# Patient Record
Sex: Male | Born: 1940 | Race: White | Hispanic: No | Marital: Married | Smoking: Former smoker
Health system: Southern US, Community
[De-identification: ages and names within clinical notes are randomized; demographics above are authoritative.]

## PROBLEM LIST (undated history)

## (undated) DIAGNOSIS — I10 Essential (primary) hypertension: Secondary | ICD-10-CM

## (undated) DIAGNOSIS — I219 Acute myocardial infarction, unspecified: Secondary | ICD-10-CM

## (undated) DIAGNOSIS — N200 Calculus of kidney: Secondary | ICD-10-CM

## (undated) DIAGNOSIS — Z9889 Other specified postprocedural states: Secondary | ICD-10-CM

## (undated) DIAGNOSIS — I2510 Atherosclerotic heart disease of native coronary artery without angina pectoris: Secondary | ICD-10-CM

## (undated) DIAGNOSIS — K219 Gastro-esophageal reflux disease without esophagitis: Secondary | ICD-10-CM

## (undated) DIAGNOSIS — L409 Psoriasis, unspecified: Secondary | ICD-10-CM

## (undated) HISTORY — PX: BRAIN SURGERY: SHX531

## (undated) SURGERY — Surgical Case
Anesthesia: *Unknown

---

## 2012-07-25 ENCOUNTER — Emergency Department: Payer: Self-pay | Admitting: Emergency Medicine

## 2012-07-31 ENCOUNTER — Emergency Department: Payer: Self-pay | Admitting: Emergency Medicine

## 2012-12-23 ENCOUNTER — Emergency Department: Payer: Self-pay | Admitting: Emergency Medicine

## 2012-12-23 LAB — URINALYSIS, COMPLETE
Bilirubin,UR: NEGATIVE
Ketone: NEGATIVE
Leukocyte Esterase: NEGATIVE
Nitrite: NEGATIVE
Ph: 7 (ref 4.5–8.0)
Protein: NEGATIVE
Squamous Epithelial: <1
WBC UR: <1 /HPF (ref 0–5)

## 2012-12-23 LAB — CBC WITH DIFFERENTIAL/PLATELET
HGB: 16.7 g/dL (ref 13.0–18.0)
Lymphocyte #: 3.9 10*3/uL — ABNORMAL HIGH (ref 1.0–3.6)
Lymphocyte %: 26.6 %
MCHC: 33 g/dL (ref 32.0–36.0)
Monocyte %: 7.6 %
Neutrophil #: 9.3 10*3/uL — ABNORMAL HIGH (ref 1.4–6.5)
RDW: 12.9 % (ref 11.5–14.5)
WBC: 14.5 10*3/uL — ABNORMAL HIGH (ref 3.8–10.6)

## 2012-12-23 LAB — COMPREHENSIVE METABOLIC PANEL
Alkaline Phosphatase: 90 U/L (ref 50–136)
Bilirubin,Total: 0.4 mg/dL (ref 0.2–1.0)
Calcium, Total: 9.5 mg/dL (ref 8.5–10.1)
Co2: 27 mmol/L (ref 21–32)
Creatinine: 0.74 mg/dL (ref 0.60–1.30)
EGFR (African American): >60
Glucose: 107 mg/dL — ABNORMAL HIGH (ref 65–99)
Osmolality: 280 (ref 275–301)
Potassium: 4.1 mmol/L (ref 3.5–5.1)
SGOT(AST): 28 U/L (ref 15–37)
SGPT (ALT): 39 U/L (ref 12–78)
Sodium: 139 mmol/L (ref 136–145)
Total Protein: 8.1 g/dL (ref 6.4–8.2)

## 2013-01-08 ENCOUNTER — Ambulatory Visit: Payer: Self-pay | Admitting: Urology

## 2015-02-05 ENCOUNTER — Inpatient Hospital Stay: Payer: Self-pay | Admitting: Internal Medicine

## 2015-02-08 ENCOUNTER — Inpatient Hospital Stay (HOSPITAL_COMMUNITY)
Admit: 2015-02-08 | Discharge: 2015-02-14 | DRG: 236 | Disposition: A | Discharge status: Home / Self Care | Payer: Commercial Managed Care - HMO | Source: Other Acute Inpatient Hospital | Attending: Surgery | Admitting: Surgery

## 2015-02-08 ENCOUNTER — Inpatient Hospital Stay: Admission: AD | Admit: 2015-02-08 | Payer: Self-pay | Source: Other Acute Inpatient Hospital | Admitting: Surgery

## 2015-02-08 ENCOUNTER — Inpatient Hospital Stay: Admit: 2015-02-08 | Payer: Self-pay | Admitting: Surgery

## 2015-02-08 DIAGNOSIS — I214 Non-ST elevation (NSTEMI) myocardial infarction: Secondary | ICD-10-CM | POA: Diagnosis present

## 2015-02-08 DIAGNOSIS — I25700 Atherosclerosis of coronary artery bypass graft(s), unspecified, with unstable angina pectoris: Secondary | ICD-10-CM

## 2015-02-08 DIAGNOSIS — R7309 Other abnormal glucose: Secondary | ICD-10-CM | POA: Diagnosis present

## 2015-02-08 DIAGNOSIS — D62 Acute posthemorrhagic anemia: Secondary | ICD-10-CM | POA: Diagnosis not present

## 2015-02-08 DIAGNOSIS — E8770 Fluid overload, unspecified: Secondary | ICD-10-CM | POA: Diagnosis not present

## 2015-02-08 DIAGNOSIS — I25110 Atherosclerotic heart disease of native coronary artery with unstable angina pectoris: Secondary | ICD-10-CM | POA: Diagnosis not present

## 2015-02-08 DIAGNOSIS — Z951 Presence of aortocoronary bypass graft: Secondary | ICD-10-CM

## 2015-02-08 DIAGNOSIS — R0789 Other chest pain: Secondary | ICD-10-CM | POA: Diagnosis present

## 2015-02-08 DIAGNOSIS — D696 Thrombocytopenia, unspecified: Secondary | ICD-10-CM | POA: Diagnosis not present

## 2015-02-08 DIAGNOSIS — I2510 Atherosclerotic heart disease of native coronary artery without angina pectoris: Secondary | ICD-10-CM | POA: Diagnosis present

## 2015-02-08 DIAGNOSIS — I10 Essential (primary) hypertension: Secondary | ICD-10-CM | POA: Diagnosis present

## 2015-02-08 DIAGNOSIS — Z87891 Personal history of nicotine dependence: Secondary | ICD-10-CM | POA: Diagnosis not present

## 2015-02-08 HISTORY — DX: Calculus of kidney: N20.0

## 2015-02-08 HISTORY — DX: Other specified postprocedural states: Z98.890

## 2015-02-08 NOTE — Progress Notes (Signed)
PATIENT ARRIVED TO TELE UNIT FROM [facility] AS A DIRECT ADMIT, VIA STRETCHER. PATIENT ASSISTED TO BED. TELE APPLIED. VITALS OBTAINED. ASSESSMENT PERFORMED.  PATIENT EDUCATED ON PERSONAL BELONGINGS AND PATIENT CARE AND BEDSIDE EQUIPMENT. INSTRUCTED TO CALL FOR ASSISTANCE WHEN NEEDED.

## 2015-02-09 ENCOUNTER — Encounter (HOSPITAL_COMMUNITY): Payer: Self-pay | Admitting: *Deleted

## 2015-02-09 ENCOUNTER — Inpatient Hospital Stay (HOSPITAL_COMMUNITY): Payer: Commercial Managed Care - HMO

## 2015-02-09 ENCOUNTER — Other Ambulatory Visit: Payer: Self-pay | Admitting: *Deleted

## 2015-02-09 DIAGNOSIS — I2510 Atherosclerotic heart disease of native coronary artery without angina pectoris: Secondary | ICD-10-CM | POA: Diagnosis present

## 2015-02-09 DIAGNOSIS — I25110 Atherosclerotic heart disease of native coronary artery with unstable angina pectoris: Secondary | ICD-10-CM

## 2015-02-09 LAB — URINE MICROSCOPIC-ADD ON

## 2015-02-09 LAB — PULMONARY FUNCTION TEST
DL/VA % pred: 128 %
DL/VA: 5.37 ml/min/mmHg/L
DLCO UNC % PRED: 114 %
DLCO UNC: 27.7 ml/min/mmHg
DLCO cor % pred: 107 %
DLCO cor: 25.97 ml/min/mmHg
FEF 25-75 POST: 2.35 L/s
FEF 25-75 Pre: 2.16 L/sec
FEF2575-%Change-Post: 8 %
FEF2575-%Pred-Post: 125 %
FEF2575-%Pred-Pre: 115 %
FEV1-%Change-Post: 4 %
FEV1-%PRED-PRE: 97 %
FEV1-%Pred-Post: 101 %
FEV1-POST: 2.5 L
FEV1-PRE: 2.4 L
FEV1FVC-%Change-Post: 5 %
FEV1FVC-%PRED-PRE: 105 %
FEV6-%Change-Post: 0 %
FEV6-%PRED-PRE: 96 %
FEV6-%Pred-Post: 96 %
FEV6-Post: 3.07 L
FEV6-Pre: 3.08 L
FEV6FVC-%CHANGE-POST: 1 %
FEV6FVC-%PRED-POST: 107 %
FEV6FVC-%Pred-Pre: 106 %
FVC-%CHANGE-POST: 0 %
FVC-%Pred-Post: 90 %
FVC-%Pred-Pre: 91 %
FVC-PRE: 3.12 L
FVC-Post: 3.1 L
POST FEV6/FVC RATIO: 100 %
PRE FEV1/FVC RATIO: 77 %
PRE FEV6/FVC RATIO: 99 %
Post FEV1/FVC ratio: 81 %
RV % pred: 87 %
RV: 1.87 L
TLC % pred: 88 %
TLC: 5.12 L

## 2015-02-09 LAB — COMPREHENSIVE METABOLIC PANEL
ALBUMIN: 4.3 g/dL (ref 3.5–5.2)
ALK PHOS: 63 U/L (ref 39–117)
ALT: 39 U/L (ref 0–53)
AST: 30 U/L (ref 0–37)
Anion gap: 12 (ref 5–15)
BUN: 13 mg/dL (ref 6–23)
CHLORIDE: 102 mmol/L (ref 96–112)
CO2: 24 mmol/L (ref 19–32)
Calcium: 9.6 mg/dL (ref 8.4–10.5)
Creatinine, Ser: 0.83 mg/dL (ref 0.50–1.35)
GFR calc Af Amer: >90 mL/min (ref >90)
GFR calc non Af Amer: 86 mL/min — ABNORMAL LOW (ref >90)
Glucose, Bld: 122 mg/dL — ABNORMAL HIGH (ref 70–99)
Potassium: 3.8 mmol/L (ref 3.5–5.1)
Sodium: 138 mmol/L (ref 135–145)
Total Bilirubin: 1 mg/dL (ref 0.3–1.2)
Total Protein: 6.9 g/dL (ref 6.0–8.3)

## 2015-02-09 LAB — URINALYSIS, ROUTINE W REFLEX MICROSCOPIC
Bilirubin Urine: NEGATIVE
GLUCOSE, UA: NEGATIVE mg/dL
KETONES UR: NEGATIVE mg/dL
LEUKOCYTES UA: NEGATIVE
Nitrite: NEGATIVE
Protein, ur: NEGATIVE mg/dL
Specific Gravity, Urine: 1.015 (ref 1.005–1.030)
UROBILINOGEN UA: 0.2 mg/dL (ref 0.0–1.0)
pH: 5.5 (ref 5.0–8.0)

## 2015-02-09 LAB — CBC
HCT: 50.1 % (ref 39.0–52.0)
Hemoglobin: 17.2 g/dL — ABNORMAL HIGH (ref 13.0–17.0)
MCH: 31.2 pg (ref 26.0–34.0)
MCHC: 34.3 g/dL (ref 30.0–36.0)
MCV: 90.8 fL (ref 78.0–100.0)
PLATELETS: 222 10*3/uL (ref 150–400)
RBC: 5.52 MIL/uL (ref 4.22–5.81)
RDW: 12.8 % (ref 11.5–15.5)
WBC: 12 10*3/uL — ABNORMAL HIGH (ref 4.0–10.5)

## 2015-02-09 LAB — TYPE AND SCREEN
ABO/RH(D): A POS
ANTIBODY SCREEN: NEGATIVE

## 2015-02-09 LAB — ABO/RH: ABO/RH(D): A POS

## 2015-02-09 LAB — SURGICAL PCR SCREEN
MRSA, PCR: NEGATIVE
STAPHYLOCOCCUS AUREUS: NEGATIVE

## 2015-02-09 LAB — HEPARIN LEVEL (UNFRACTIONATED): Heparin Unfractionated: 0.1 IU/mL — ABNORMAL LOW (ref 0.30–0.70)

## 2015-02-09 MED ORDER — SODIUM CHLORIDE 0.9 % IJ SOLN
3.0000 mL | Freq: Two times a day (BID) | INTRAMUSCULAR | Status: DC
  Administered 2015-02-09 (×2): 3 mL via INTRAVENOUS

## 2015-02-09 MED ORDER — CHLORHEXIDINE GLUCONATE CLOTH 2 % EX PADS: 6.0000 | MEDICATED_PAD | Freq: Once | CUTANEOUS | Status: DC

## 2015-02-09 MED ORDER — NITROGLYCERIN 0.4 MG/SPRAY TL SOLN: 1.0000 | Status: DC | PRN

## 2015-02-09 MED ORDER — BISACODYL 5 MG PO TBEC
5.0000 mg | DELAYED_RELEASE_TABLET | Freq: Once | ORAL | Status: DC
  Filled 2015-02-09: qty 1

## 2015-02-09 MED ORDER — DIAZEPAM 5 MG PO TABS
5.0000 mg | ORAL_TABLET | Freq: Once | ORAL | Status: AC
  Administered 2015-02-10: 5 mg via ORAL
  Filled 2015-02-09: qty 1

## 2015-02-09 MED ORDER — DOPAMINE-DEXTROSE 3.2-5 MG/ML-% IV SOLN
0.0000 ug/kg/min | INTRAVENOUS | Status: DC
  Filled 2015-02-09: qty 250

## 2015-02-09 MED ORDER — METOPROLOL TARTRATE 12.5 MG HALF TABLET
12.5000 mg | ORAL_TABLET | Freq: Once | ORAL | Status: AC
  Administered 2015-02-10: 12.5 mg via ORAL
  Filled 2015-02-09: qty 1

## 2015-02-09 MED ORDER — CHLORHEXIDINE GLUCONATE CLOTH 2 % EX PADS
6.0000 | MEDICATED_PAD | Freq: Once | CUTANEOUS | Status: AC
  Administered 2015-02-09: 6 via TOPICAL

## 2015-02-09 MED ORDER — POTASSIUM CHLORIDE 2 MEQ/ML IV SOLN
80.0000 meq | INTRAVENOUS | Status: DC
  Filled 2015-02-09: qty 40

## 2015-02-09 MED ORDER — PHENYLEPHRINE HCL 10 MG/ML IJ SOLN
30.0000 ug/min | INTRAVENOUS | Status: DC
  Filled 2015-02-09: qty 2

## 2015-02-09 MED ORDER — ASPIRIN EC 81 MG PO TBEC
81.0000 mg | DELAYED_RELEASE_TABLET | Freq: Every day | ORAL | Status: DC
  Administered 2015-02-09: 81 mg via ORAL
  Filled 2015-02-09 (×2): qty 1

## 2015-02-09 MED ORDER — HEPARIN BOLUS VIA INFUSION
4000.0000 [IU] | Freq: Once | INTRAVENOUS | Status: AC
  Administered 2015-02-09: 4000 [IU] via INTRAVENOUS
  Filled 2015-02-09: qty 4000

## 2015-02-09 MED ORDER — PAPAVERINE HCL 30 MG/ML IJ SOLN
INTRAMUSCULAR | Status: AC
  Administered 2015-02-10: 500 mL
  Filled 2015-02-09: qty 2.5

## 2015-02-09 MED ORDER — HEPARIN (PORCINE) IN NACL 100-0.45 UNIT/ML-% IJ SOLN
900.0000 [IU]/h | INTRAMUSCULAR | Status: DC
  Administered 2015-02-09: 900 [IU]/h via INTRAVENOUS
  Filled 2015-02-09: qty 250

## 2015-02-09 MED ORDER — VANCOMYCIN HCL 10 G IV SOLR
1500.0000 mg | INTRAVENOUS | Status: AC
  Administered 2015-02-10: 1500 mg via INTRAVENOUS
  Filled 2015-02-09 (×2): qty 1500

## 2015-02-09 MED ORDER — MORPHINE SULFATE 2 MG/ML IJ SOLN
2.0000 mg | INTRAMUSCULAR | Status: DC | PRN
  Administered 2015-02-09: 2 mg via INTRAVENOUS
  Filled 2015-02-09: qty 1

## 2015-02-09 MED ORDER — HEPARIN SODIUM (PORCINE) 1000 UNIT/ML IJ SOLN
INTRAMUSCULAR | Status: DC
  Filled 2015-02-09: qty 30

## 2015-02-09 MED ORDER — CHLORHEXIDINE GLUCONATE CLOTH 2 % EX PADS
6.0000 | MEDICATED_PAD | Freq: Once | CUTANEOUS | Status: AC
  Administered 2015-02-10: 6 via TOPICAL

## 2015-02-09 MED ORDER — METOPROLOL TARTRATE 25 MG PO TABS
25.0000 mg | ORAL_TABLET | Freq: Two times a day (BID) | ORAL | Status: DC
  Administered 2015-02-09 (×2): 25 mg via ORAL
  Filled 2015-02-09 (×4): qty 1

## 2015-02-09 MED ORDER — HEPARIN BOLUS VIA INFUSION
2000.0000 [IU] | Freq: Once | INTRAVENOUS | Status: AC
  Administered 2015-02-09: 2000 [IU] via INTRAVENOUS
  Filled 2015-02-09: qty 2000

## 2015-02-09 MED ORDER — ALBUTEROL SULFATE (2.5 MG/3ML) 0.083% IN NEBU
2.5000 mg | INHALATION_SOLUTION | Freq: Once | RESPIRATORY_TRACT | Status: AC
  Administered 2015-02-09: 2.5 mg via RESPIRATORY_TRACT

## 2015-02-09 MED ORDER — DEXTROSE 5 % IV SOLN
0.0000 ug/min | INTRAVENOUS | Status: DC
  Filled 2015-02-09: qty 4

## 2015-02-09 MED ORDER — SODIUM CHLORIDE 0.9 % IJ SOLN: 3.0000 mL | INTRAMUSCULAR | Status: DC | PRN

## 2015-02-09 MED ORDER — ALPRAZOLAM 0.25 MG PO TABS: 0.2500 mg | ORAL_TABLET | ORAL | Status: DC | PRN

## 2015-02-09 MED ORDER — HEPARIN (PORCINE) IN NACL 100-0.45 UNIT/ML-% IJ SOLN
1150.0000 [IU]/h | INTRAMUSCULAR | Status: DC
  Administered 2015-02-09 – 2015-02-10 (×2): 1150 [IU]/h via INTRAVENOUS
  Filled 2015-02-09 (×3): qty 250

## 2015-02-09 MED ORDER — DEXMEDETOMIDINE HCL IN NACL 400 MCG/100ML IV SOLN
0.1000 ug/kg/h | INTRAVENOUS | Status: AC
  Administered 2015-02-10: 0.2 ug/kg/h via INTRAVENOUS
  Filled 2015-02-09: qty 100

## 2015-02-09 MED ORDER — DEXTROSE 5 % IV SOLN
1.5000 g | INTRAVENOUS | Status: AC
  Administered 2015-02-10: 1.5 g via INTRAVENOUS
  Administered 2015-02-10: 750 g via INTRAVENOUS
  Filled 2015-02-09: qty 1.5

## 2015-02-09 MED ORDER — SODIUM CHLORIDE 0.9 % IV SOLN
INTRAVENOUS | Status: AC
  Administered 2015-02-10: 68 mL/h via INTRAVENOUS
  Filled 2015-02-09: qty 40

## 2015-02-09 MED ORDER — NITROGLYCERIN 0.4 MG SL SUBL
0.4000 mg | SUBLINGUAL_TABLET | SUBLINGUAL | Status: DC | PRN
  Administered 2015-02-09 (×2): 0.4 mg via SUBLINGUAL
  Filled 2015-02-09 (×4): qty 1

## 2015-02-09 MED ORDER — MAGNESIUM SULFATE 50 % IJ SOLN
40.0000 meq | INTRAMUSCULAR | Status: DC
  Filled 2015-02-09: qty 10

## 2015-02-09 MED ORDER — SIMVASTATIN 40 MG PO TABS
40.0000 mg | ORAL_TABLET | Freq: Every day | ORAL | Status: DC
  Administered 2015-02-09 – 2015-02-13 (×3): 40 mg via ORAL
  Filled 2015-02-09 (×6): qty 1

## 2015-02-09 MED ORDER — TEMAZEPAM 15 MG PO CAPS
15.0000 mg | ORAL_CAPSULE | Freq: Once | ORAL | Status: AC | PRN
  Administered 2015-02-09: 15 mg via ORAL
  Filled 2015-02-09: qty 1

## 2015-02-09 MED ORDER — DEXTROSE 5 % IV SOLN
750.0000 mg | INTRAVENOUS | Status: DC
  Filled 2015-02-09: qty 750

## 2015-02-09 MED ORDER — SODIUM CHLORIDE 0.9 % IV SOLN: 250.0000 mL | INTRAVENOUS | Status: DC | PRN

## 2015-02-09 MED ORDER — NITROGLYCERIN IN D5W 200-5 MCG/ML-% IV SOLN
2.0000 ug/min | INTRAVENOUS | Status: AC
  Administered 2015-02-10: 3 ug/min via INTRAVENOUS
  Filled 2015-02-09: qty 250

## 2015-02-09 MED ORDER — SODIUM CHLORIDE 0.9 % IV SOLN
INTRAVENOUS | Status: AC
  Administered 2015-02-10: 1.2 [IU]/h via INTRAVENOUS
  Filled 2015-02-09: qty 2.5

## 2015-02-09 NOTE — Progress Notes (Addendum)
At 1330 pt called with c/o chest pain 4-5/10, after obtained NTG SL from pyxis and back to pt's room, pt no longer had chest pain. Pt has been letting staff know he has had some mild chest pain today after it has come and gone with pain being a 1-2/10, pain has been occurring after pt has gotten up to side of bed, or up to bathroom, --on exertion.   Pt had been to bathroom and back when he had pain this time.  O2 2L applied, notified Erin Barrett, PA of pt's intermittent CP,  (pt had been on NTG patch at [facility] and is no longer on it-- pt inquiring about it as well as RN if pt needed to be on any kind on continuous nitro patch or paste.). No new orders, to use prn NTG SL or morphine for pain per PA. Will continue use of O2.  Pt and wife at bedside informed of plan.

## 2015-02-09 NOTE — Progress Notes (Signed)
PATIENT CURRENTLY WITHOUT ORDERS. STABLE WITHOUT COMPLAINTS. DR. Cyndia Bent NOTIFIED.

## 2015-02-09 NOTE — Progress Notes (Signed)
VASCULAR LAB PRELIMINARY  PRELIMINARY  PRELIMINARY  PRELIMINARY  Pre-op Cardiac Surgery  Carotid Findings:  Bilateral:  1-39% ICA stenosis.  Vertebral artery flow is antegrade.     Upper Extremity Right Left  Brachial Pressures 137 Triphasic 135 Triphasic  Radial Waveforms Triphasic Triphasic  Ulnar Waveforms Triphasic Triphasic  Palmar Arch (Allen's Test) Abnormal Abnormal   Findings:  Right Doppler waveforms remained normal with radial compression and diminised 50% with ulnar compression. Left Doppler waveforms obliterated with radial compression and diminished greater that 50% with ulnar compression.    Lower  Extremity Right Left  Dorsalis Pedis    Anterior Tibial    Posterior Tibial    Ankle/Brachial Indices      Findings:  Pedal pulses were palpable bilaterally.   Thresea Doble, RVS 02/09/2015, 7:02 PM

## 2015-02-09 NOTE — Progress Notes (Signed)
ANTICOAGULATION CONSULT NOTE - FOLLOW UP    HL = 0.1 (goal 0.3 - 0.7 units/mL) Heparin dosing weight =  78 kg   Assessment: 73 YOM to continue on IV heparin for ACS.  Heparin level sub-therapeutic.  No bleeding nor issue with infusion per RN.   Plan: - Rebolus with 2000 units of IV heparin, then - Increase heparin gtt to 1150 units/hr - Check 8 hr HL - CABG in AM    Demarquez Ciolek D. Mina Marble, PharmD, BCPS 02/09/2015, 8:09 PM

## 2015-02-09 NOTE — Progress Notes (Signed)
ANTICOAGULATION CONSULT NOTE - Initial Consult  Pharmacy Consult for Heparin  Indication: chest pain/ACS  No Known Allergies  Patient Measurements: Height: 5' 4.5" (163.8 cm) Weight: 189 lb 6 oz (85.9 kg) IBW/kg (Calculated) : 60.35 Heparin Dosing Weight: 78 kg   Vital Signs: Temp: 98 F (36.7 C) (03/15 2238) Temp Source: Oral (03/15 2238) BP: 133/70 mmHg (03/15 2238) Pulse Rate: 66 (03/15 2238)  Labs: No results for input(s): HGB, HCT, PLT, APTT, LABPROT, INR, HEPARINUNFRC, CREATININE, CKTOTAL, CKMB, TROPONINI in the last 72 hours.  CrCl cannot be calculated (Patient has no serum creatinine result on file.).   Medical History: Past Medical History  Diagnosis Date  . S/P cardiac cath   . Nephrolithiasis     Medications:  No prescriptions prior to admission    Assessment: 83 YOM brought to [facility] from [facility] for chest pain/NSTEMI. S/p cardiac cath which showed multivessel CAD and patient is now awaiting CABG tomorrow morning. Pharmacy consulted to start IV heparin. At [facility], patient was on twice daily Lovenox but patient unsure when last dose was. Since patient has been here since ~ 10 PM last night, it is safe to assume that patient's has completely cleared the Lovenox.  CBC from [facility] reviewed. H/H and Plt wnl   Goal of Therapy:  Heparin level 0.3-0.7 units/ml Monitor platelets by anticoagulation protocol: Yes   Plan:  -Heparin 4000 units IV bolus followed by heparin infusion at 900 units/hr -F/u 8 hr HL -Monitor daily HL, CBC and s/s of bleeding   Albertina Parr, PharmD., BCPS Clinical Pharmacist Pager [phone]

## 2015-02-09 NOTE — Progress Notes (Signed)
Utilization review completed.  

## 2015-02-09 NOTE — Progress Notes (Signed)
Pt sts he is having 1/10 chest tightness and that that usually increases with activity. Will not ambulate. Discussed sternal precautions, IS, mobility post op. Voiced understanding. Pt given OHS booklet and guideline and video to watch. Pt very motivated. Will f/u post op [id number] Yves Dill CES, ACSM 10:24 AM 02/09/2015

## 2015-02-09 NOTE — H&P (Signed)
[address]       [place] [address]             [phone]        Fain J Lirette Broaddus Medical Record #[id number] Date of Birth: 1941/11/18  Referring: Nehemiah Massed Primary Care: Juluis Pitch, MD  Chief Complaint:   Chest Pain/NSTEMI  History of Present Illness:      Mr. Vanderloop is a 74 yo male with previous history of Kidney stones.  He presented to the Emergency Department at [facility] with complaints of chest pain.  He stated this developed a day prior to admission and later subsided.  However the next day the patient was doing yard work at which time he became diaphoretic and felt like someone was sitting on his chest.  This pain radiated down both arms.  This prompted presentation to the ED.  EKG was unremarkable.  He was hypertensive and his initial troponin level was elevated at 0.8 and he was ruled in for NSTEMI and admitted for further care.  Cardiology consult was obtained and the patient was evaluated by Dr. Nehemiah Massed who recommended Cardiac catheterization.  This was done and showed multivessel CAD and it was Coronary Bypass Grafting would be indicated.  TCTS was consulted and Dr. Cyndia Bent accepted the patient in transfer to [facility] for further care.  Currently the patient is stable.  He continues to have some intermittent chest tightness.  He denies dyspnea, nausea, or vomiting.  He denies recent smoking history, but did use about 30 years ago.  Current Activity/ Functional Status: Patient is independent with mobility/ambulation, transfers, ADL's, IADL's.   Zubrod Score: At the time of surgery this patient's most appropriate activity status/level should be described as: [x]     0    Normal activity, no symptoms []     1    Restricted in physical strenuous activity but ambulatory, able to do out light work []     2    Ambulatory and capable of self care, unable to do work activities, up and about                 more than 50%  Of the time                             []     3    Only limited self care, in bed greater than 50% of waking hours []     4    Completely disabled, no self care, confined to bed or chair []     5    Moribund  Past Medical History  Diagnosis Date  . S/P cardiac cath   . Nephrolithiasis     History reviewed. No pertinent past surgical history.  History  Smoking status  . Not on file  Smokeless tobacco  . Not on file    History  Alcohol Use: Not on file    History   Social History  . Marital Status: Married    Spouse Name: N/A  . Number of Children: N/A  . Years of Education: N/A   Occupational History  . Not on file.   Social History Main Topics  . Smoking status: Not on file  . Smokeless tobacco: Not on file  . Alcohol Use: Not on file  . Drug Use: No  . Sexual Activity: Not on file   Other Topics Concern  . Not on file  Social History Narrative  . No narrative on file    No Known Allergies  Current Facility-Administered Medications  Medication Dose Route Frequency Provider Last Rate Last Dose  . 0.9 %  sodium chloride infusion  250 mL Intravenous PRN Gaye Pollack, MD      . ALPRAZolam Duanne Moron) tablet 0.25-0.5 mg  0.25-0.5 mg Oral Q4H PRN Gaye Pollack, MD      . aspirin EC tablet 81 mg  81 mg Oral Daily Gaye Pollack, MD      . bisacodyl (DULCOLAX) EC tablet 5 mg  5 mg Oral Once Gaye Pollack, MD      . Chlorhexidine Gluconate Cloth 2 % PADS 6 each  6 each Topical Once Gaye Pollack, MD       And  . Chlorhexidine Gluconate Cloth 2 % PADS 6 each  6 each Topical Once Gaye Pollack, MD      . Derrill Memo ON 02/10/2015] diazepam (VALIUM) tablet 5 mg  5 mg Oral Once Gaye Pollack, MD      . Derrill Memo ON 02/10/2015] metoprolol tartrate (LOPRESSOR) tablet 12.5 mg  12.5 mg Oral Once Gaye Pollack, MD      . metoprolol tartrate (LOPRESSOR) tablet 25 mg  25 mg Oral BID Gaye Pollack, MD      . morphine 2 MG/ML injection 2 mg  2 mg Intravenous Q2H PRN Gaye Pollack, MD      .  nitroGLYCERIN (NITROSTAT) SL tablet 0.4 mg  0.4 mg Sublingual Q5 min PRN Gaye Pollack, MD      . simvastatin (ZOCOR) tablet 40 mg  40 mg Oral q1800 Erin R Barrett, PA-C      . sodium chloride 0.9 % injection 3 mL  3 mL Intravenous Q12H Gaye Pollack, MD      . sodium chloride 0.9 % injection 3 mL  3 mL Intravenous PRN Gaye Pollack, MD      . temazepam (RESTORIL) capsule 15 mg  15 mg Oral Once PRN Gaye Pollack, MD        No prescriptions prior to admission   History reviewed. No pertinent family history.  Review of Systems:   Pertinent items are noted in HPI.     Cardiac Review of Systems: Y or N  Chest Pain [  y  ]  Resting SOB [ n  ] Exertional SOB  [n  ]  Orthopnea [  ]   Pedal Edema [   ]    Palpitations [ n ] Syncope  [  ]   Presyncope [   ]  General Review of Systems: [Y] = yes [  ]=no Constitional: recent weight change [  ]; anorexia [  ]; fatigue [  ]; nausea [ n ]; night sweats [  ]; fever [  ]; or chills [  ]                                                               Dental: poor dentition[  ]; Last Dentist visit:   Eye : blurred vision [  ]; diplopia [   ]; vision changes [  ];  Amaurosis fugax[  ]; Resp: cough [n  ];  wheezing[  ];  hemoptysis[  ]; shortness of breath[n  ]; paroxysmal nocturnal dyspnea[  ]; dyspnea on exertion[  ]; or orthopnea[  ];  GI:  gallstones[  ], vomiting[n  ];  dysphagia[  ]; melena[  ];  hematochezia [  ]; heartburn[  ];   Hx of  Colonoscopy[  ]; GU: kidney stones [ y ]; hematuria[  ];   dysuria [  ];  nocturia[  ];  history of     obstruction [  ]; urinary frequency [  ]             Skin: rash, swelling[n  ];, hair loss[  ];  peripheral edema[n  ];  or itching[  ]; Musculosketetal: myalgias[  ];  joint swelling[  ];  joint erythema[  ];  joint pain[  ];  back pain[  ];  Heme/Lymph: bruising[  ];  bleeding[  ];  anemia[  ];  Neuro: TIA[  ];  headaches[  ];  stroke[  ];  vertigo[  ];  seizures[  ];   paresthesias[  ];  difficulty walking[   ];  Psych:depression[  ]; anxiety[  ];  Endocrine: diabetes[  ];  thyroid dysfunction[  ];  Immunizations: Flu [  ]; Pneumococcal[  ];  Other:  Physical Exam: BP 133/70 mmHg  Pulse 66  Temp(Src) 98 F (36.7 C) (Oral)  Resp 18  Ht 5' 4.5" (1.638 m)  Wt 189 lb 6 oz (85.9 kg)  BMI 32.02 kg/m2  SpO2 97%  General appearance: alert, cooperative and no distress Head: Normocephalic, without obvious abnormality, atraumatic Neck: no adenopathy, no carotid bruit, no JVD, supple, symmetrical, trachea midline and thyroid not enlarged, symmetric, no tenderness/mass/nodules Lymph nodes: Cervical, supraclavicular, and axillary nodes normal. Resp: clear to auscultation bilaterally Cardio: regular rate and rhythm GI: soft, non-tender; bowel sounds normal; no masses,  no organomegaly Extremities: extremities normal, atraumatic, no cyanosis or edema Neurologic: Grossly normal  Diagnostic Studies & Laboratory data: CBC, BMET, Hgb A1c Pending, labs reviewed from OSH     Recent Radiology Findings:    I have independently reviewed the above radiologic studies.  Assessment / Plan:   1.  Coronary Artery Disease, S/P NSTEMI- plan for CABG in AM continue Lopressor, ASA, Stain 2. Hypertriglyceridemia- >400 at OSH 3. H/O Kidney Stones 4. Hypertension- continue Lopressor 5. Dispo- patient stable, labs pending, complete preop workup, plan for OR in AM     I discussed the operative procedure with the patient and his wife including alternatives, benefits and risks; including but not limited to bleeding, blood transfusion, infection, stroke, myocardial infarction, graft failure, heart block requiring a permanent pacemaker, organ dysfunction, and death.  Horton Finer understands and agrees to proceed.     Gaye Pollack, MD 02/09/2015

## 2015-02-10 ENCOUNTER — Inpatient Hospital Stay (HOSPITAL_COMMUNITY): Payer: Commercial Managed Care - HMO | Admitting: Anesthesiology

## 2015-02-10 ENCOUNTER — Encounter (HOSPITAL_COMMUNITY): Payer: Self-pay | Admitting: Anesthesiology

## 2015-02-10 ENCOUNTER — Inpatient Hospital Stay (HOSPITAL_COMMUNITY): Payer: Commercial Managed Care - HMO

## 2015-02-10 ENCOUNTER — Encounter (HOSPITAL_COMMUNITY)
Disposition: A | Discharge status: Home / Self Care | Payer: Commercial Managed Care - HMO | Source: Other Acute Inpatient Hospital | Attending: Surgery

## 2015-02-10 HISTORY — PX: TEE WITHOUT CARDIOVERSION: SHX5443

## 2015-02-10 HISTORY — PX: CORONARY ARTERY BYPASS GRAFT: SHX141

## 2015-02-10 LAB — BLOOD GAS, ARTERIAL
Acid-base deficit: 1 mmol/L (ref 0.0–2.0)
BICARBONATE: 23 meq/L (ref 20.0–24.0)
DRAWN BY: 364961
FIO2: 0.21 %
O2 Saturation: 94.9 %
PCO2 ART: 37.3 mmHg (ref 35.0–45.0)
PH ART: 7.407 (ref 7.350–7.450)
Patient temperature: 98.6
TCO2: 24.2 mmol/L (ref 0–100)
pO2, Arterial: 75.4 mmHg — ABNORMAL LOW (ref 80.0–100.0)

## 2015-02-10 LAB — CBC
HCT: 47.4 % (ref 39.0–52.0)
HCT: 36.6 % — ABNORMAL LOW (ref 39.0–52.0)
HCT: 37.9 % — ABNORMAL LOW (ref 39.0–52.0)
HEMOGLOBIN: 12.4 g/dL — AB (ref 13.0–17.0)
Hemoglobin: 16.3 g/dL (ref 13.0–17.0)
Hemoglobin: 13.1 g/dL (ref 13.0–17.0)
MCH: 30.9 pg (ref 26.0–34.0)
MCH: 30.2 pg (ref 26.0–34.0)
MCH: 30.6 pg (ref 26.0–34.0)
MCHC: 34.4 g/dL (ref 30.0–36.0)
MCHC: 33.9 g/dL (ref 30.0–36.0)
MCHC: 34.6 g/dL (ref 30.0–36.0)
MCV: 89.8 fL (ref 78.0–100.0)
MCV: 89.3 fL (ref 78.0–100.0)
MCV: 88.6 fL (ref 78.0–100.0)
PLATELETS: 134 10*3/uL — AB (ref 150–400)
Platelets: 208 10*3/uL (ref 150–400)
Platelets: 178 10*3/uL (ref 150–400)
RBC: 5.28 MIL/uL (ref 4.22–5.81)
RBC: 4.1 MIL/uL — AB (ref 4.22–5.81)
RBC: 4.28 MIL/uL (ref 4.22–5.81)
RDW: 12.9 % (ref 11.5–15.5)
RDW: 12.9 % (ref 11.5–15.5)
RDW: 13 % (ref 11.5–15.5)
WBC: 11.8 10*3/uL — AB (ref 4.0–10.5)
WBC: 19.7 10*3/uL — ABNORMAL HIGH (ref 4.0–10.5)
WBC: 22.1 10*3/uL — ABNORMAL HIGH (ref 4.0–10.5)

## 2015-02-10 LAB — POCT I-STAT, CHEM 8
BUN: 16 mg/dL (ref 6–23)
BUN: 14 mg/dL (ref 6–23)
BUN: 14 mg/dL (ref 6–23)
BUN: 13 mg/dL (ref 6–23)
BUN: 13 mg/dL (ref 6–23)
BUN: 11 mg/dL (ref 6–23)
CALCIUM ION: 1.23 mmol/L (ref 1.13–1.30)
CALCIUM ION: 1.21 mmol/L (ref 1.13–1.30)
CALCIUM ION: 1.06 mmol/L — AB (ref 1.13–1.30)
CALCIUM ION: 1.14 mmol/L (ref 1.13–1.30)
CHLORIDE: 99 mmol/L (ref 96–112)
CHLORIDE: 102 mmol/L (ref 96–112)
CHLORIDE: 101 mmol/L (ref 96–112)
CREATININE: 0.6 mg/dL (ref 0.50–1.35)
CREATININE: 0.7 mg/dL (ref 0.50–1.35)
Calcium, Ion: 1.13 mmol/L (ref 1.13–1.30)
Calcium, Ion: 1.08 mmol/L — ABNORMAL LOW (ref 1.13–1.30)
Chloride: 103 mmol/L (ref 96–112)
Chloride: 103 mmol/L (ref 96–112)
Chloride: 102 mmol/L (ref 96–112)
Creatinine, Ser: 0.7 mg/dL (ref 0.50–1.35)
Creatinine, Ser: 0.6 mg/dL (ref 0.50–1.35)
Creatinine, Ser: 0.6 mg/dL (ref 0.50–1.35)
Creatinine, Ser: 0.6 mg/dL (ref 0.50–1.35)
GLUCOSE: 108 mg/dL — AB (ref 70–99)
Glucose, Bld: 100 mg/dL — ABNORMAL HIGH (ref 70–99)
Glucose, Bld: 104 mg/dL — ABNORMAL HIGH (ref 70–99)
Glucose, Bld: 122 mg/dL — ABNORMAL HIGH (ref 70–99)
Glucose, Bld: 143 mg/dL — ABNORMAL HIGH (ref 70–99)
Glucose, Bld: 132 mg/dL — ABNORMAL HIGH (ref 70–99)
HCT: 44 % (ref 39.0–52.0)
HCT: 40 % (ref 39.0–52.0)
HCT: 33 % — ABNORMAL LOW (ref 39.0–52.0)
HCT: 38 % — ABNORMAL LOW (ref 39.0–52.0)
HEMATOCRIT: 31 % — AB (ref 39.0–52.0)
HEMATOCRIT: 33 % — AB (ref 39.0–52.0)
HEMOGLOBIN: 10.5 g/dL — AB (ref 13.0–17.0)
Hemoglobin: 15 g/dL (ref 13.0–17.0)
Hemoglobin: 13.6 g/dL (ref 13.0–17.0)
Hemoglobin: 11.2 g/dL — ABNORMAL LOW (ref 13.0–17.0)
Hemoglobin: 11.2 g/dL — ABNORMAL LOW (ref 13.0–17.0)
Hemoglobin: 12.9 g/dL — ABNORMAL LOW (ref 13.0–17.0)
POTASSIUM: 3.9 mmol/L (ref 3.5–5.1)
POTASSIUM: 3.9 mmol/L (ref 3.5–5.1)
POTASSIUM: 4.1 mmol/L (ref 3.5–5.1)
Potassium: 5.3 mmol/L — ABNORMAL HIGH (ref 3.5–5.1)
Potassium: 5 mmol/L (ref 3.5–5.1)
Potassium: 4.3 mmol/L (ref 3.5–5.1)
Sodium: 140 mmol/L (ref 135–145)
Sodium: 140 mmol/L (ref 135–145)
Sodium: 134 mmol/L — ABNORMAL LOW (ref 135–145)
Sodium: 137 mmol/L (ref 135–145)
Sodium: 138 mmol/L (ref 135–145)
Sodium: 140 mmol/L (ref 135–145)
TCO2: 20 mmol/L (ref 0–100)
TCO2: 22 mmol/L (ref 0–100)
TCO2: 22 mmol/L (ref 0–100)
TCO2: 21 mmol/L (ref 0–100)
TCO2: 20 mmol/L (ref 0–100)
TCO2: 22 mmol/L (ref 0–100)

## 2015-02-10 LAB — POCT I-STAT 3, ART BLOOD GAS (G3+)
ACID-BASE DEFICIT: 1 mmol/L (ref 0.0–2.0)
ACID-BASE DEFICIT: 4 mmol/L — AB (ref 0.0–2.0)
ACID-BASE DEFICIT: 6 mmol/L — AB (ref 0.0–2.0)
Acid-base deficit: 6 mmol/L — ABNORMAL HIGH (ref 0.0–2.0)
Acid-base deficit: 5 mmol/L — ABNORMAL HIGH (ref 0.0–2.0)
BICARBONATE: 25 meq/L — AB (ref 20.0–24.0)
Bicarbonate: 21.8 mEq/L (ref 20.0–24.0)
Bicarbonate: 19.9 mEq/L — ABNORMAL LOW (ref 20.0–24.0)
Bicarbonate: 20.4 mEq/L (ref 20.0–24.0)
Bicarbonate: 21.6 mEq/L (ref 20.0–24.0)
O2 SAT: 100 %
O2 SAT: 92 %
O2 SAT: 94 %
O2 Saturation: 100 %
O2 Saturation: 94 %
PCO2 ART: 43.7 mmHg (ref 35.0–45.0)
PH ART: 7.32 — AB (ref 7.350–7.450)
PH ART: 7.303 — AB (ref 7.350–7.450)
PO2 ART: 228 mmHg — AB (ref 80.0–100.0)
PO2 ART: 66 mmHg — AB (ref 80.0–100.0)
PO2 ART: 81 mmHg (ref 80.0–100.0)
Patient temperature: 36.7
Patient temperature: 37.4
Patient temperature: 37.4
TCO2: 26 mmol/L (ref 0–100)
TCO2: 23 mmol/L (ref 0–100)
TCO2: 21 mmol/L (ref 0–100)
TCO2: 22 mmol/L (ref 0–100)
TCO2: 23 mmol/L (ref 0–100)
pCO2 arterial: 46.2 mmHg — ABNORMAL HIGH (ref 35.0–45.0)
pCO2 arterial: 40.2 mmHg (ref 35.0–45.0)
pCO2 arterial: 38.6 mmHg (ref 35.0–45.0)
pCO2 arterial: 43.5 mmHg (ref 35.0–45.0)
pH, Arterial: 7.341 — ABNORMAL LOW (ref 7.350–7.450)
pH, Arterial: 7.342 — ABNORMAL LOW (ref 7.350–7.450)
pH, Arterial: 7.28 — ABNORMAL LOW (ref 7.350–7.450)
pO2, Arterial: 378 mmHg — ABNORMAL HIGH (ref 80.0–100.0)
pO2, Arterial: 80 mmHg (ref 80.0–100.0)

## 2015-02-10 LAB — HEPARIN LEVEL (UNFRACTIONATED): HEPARIN UNFRACTIONATED: 0.23 [IU]/mL — AB (ref 0.30–0.70)

## 2015-02-10 LAB — CREATININE, SERUM
Creatinine, Ser: 0.77 mg/dL (ref 0.50–1.35)
GFR calc Af Amer: >90 mL/min (ref >90)
GFR calc non Af Amer: 89 mL/min — ABNORMAL LOW (ref >90)

## 2015-02-10 LAB — POCT I-STAT 4, (NA,K, GLUC, HGB,HCT)
Glucose, Bld: 107 mg/dL — ABNORMAL HIGH (ref 70–99)
HCT: 35 % — ABNORMAL LOW (ref 39.0–52.0)
Hemoglobin: 11.9 g/dL — ABNORMAL LOW (ref 13.0–17.0)
POTASSIUM: 4 mmol/L (ref 3.5–5.1)
SODIUM: 140 mmol/L (ref 135–145)

## 2015-02-10 LAB — HEMOGLOBIN AND HEMATOCRIT, BLOOD
HCT: 33.1 % — ABNORMAL LOW (ref 39.0–52.0)
HEMOGLOBIN: 11.6 g/dL — AB (ref 13.0–17.0)

## 2015-02-10 LAB — GLUCOSE, CAPILLARY
GLUCOSE-CAPILLARY: 133 mg/dL — AB (ref 70–99)
GLUCOSE-CAPILLARY: 69 mg/dL — AB (ref 70–99)
GLUCOSE-CAPILLARY: 117 mg/dL — AB (ref 70–99)
GLUCOSE-CAPILLARY: 152 mg/dL — AB (ref 70–99)
GLUCOSE-CAPILLARY: 106 mg/dL — AB (ref 70–99)
Glucose-Capillary: 105 mg/dL — ABNORMAL HIGH (ref 70–99)
Glucose-Capillary: 113 mg/dL — ABNORMAL HIGH (ref 70–99)
Glucose-Capillary: 120 mg/dL — ABNORMAL HIGH (ref 70–99)
Glucose-Capillary: 138 mg/dL — ABNORMAL HIGH (ref 70–99)
Glucose-Capillary: 113 mg/dL — ABNORMAL HIGH (ref 70–99)
Glucose-Capillary: 117 mg/dL — ABNORMAL HIGH (ref 70–99)

## 2015-02-10 LAB — APTT
aPTT: 38 seconds — ABNORMAL HIGH (ref 24–37)
aPTT: 77 seconds — ABNORMAL HIGH (ref 24–37)

## 2015-02-10 LAB — HEMOGLOBIN A1C
HEMOGLOBIN A1C: 5.6 % (ref 4.8–5.6)
Mean Plasma Glucose: 114 mg/dL

## 2015-02-10 LAB — MAGNESIUM: Magnesium: 2.6 mg/dL — ABNORMAL HIGH (ref 1.5–2.5)

## 2015-02-10 LAB — PROTIME-INR
INR: 1.01 (ref 0.00–1.49)
INR: 1.35 (ref 0.00–1.49)
PROTHROMBIN TIME: 13.4 s (ref 11.6–15.2)
Prothrombin Time: 16.8 seconds — ABNORMAL HIGH (ref 11.6–15.2)

## 2015-02-10 LAB — PLATELET COUNT: PLATELETS: 158 10*3/uL (ref 150–400)

## 2015-02-10 SURGERY — CORONARY ARTERY BYPASS GRAFTING (CABG)
Anesthesia: General | Site: Chest

## 2015-02-10 MED ORDER — SUCCINYLCHOLINE CHLORIDE 20 MG/ML IJ SOLN
INTRAMUSCULAR | Status: AC
  Filled 2015-02-10: qty 1

## 2015-02-10 MED ORDER — DEXMEDETOMIDINE HCL IN NACL 200 MCG/50ML IV SOLN
0.0000 ug/kg/h | INTRAVENOUS | Status: DC
  Filled 2015-02-10: qty 50

## 2015-02-10 MED ORDER — DEXTROSE 50 % IV SOLN
INTRAVENOUS | Status: AC
  Administered 2015-02-10: 50 mL
  Filled 2015-02-10: qty 50

## 2015-02-10 MED ORDER — VECURONIUM BROMIDE 10 MG IV SOLR
INTRAVENOUS | Status: DC | PRN
  Administered 2015-02-10 (×2): 5 mg via INTRAVENOUS

## 2015-02-10 MED ORDER — PHENYLEPHRINE 40 MCG/ML (10ML) SYRINGE FOR IV PUSH (FOR BLOOD PRESSURE SUPPORT)
PREFILLED_SYRINGE | INTRAVENOUS | Status: AC
  Filled 2015-02-10: qty 10

## 2015-02-10 MED ORDER — PROPOFOL 10 MG/ML IV BOLUS
INTRAVENOUS | Status: DC | PRN
  Administered 2015-02-10: 20 mg via INTRAVENOUS

## 2015-02-10 MED ORDER — MIDAZOLAM HCL 2 MG/2ML IJ SOLN: 2.0000 mg | INTRAMUSCULAR | Status: DC | PRN

## 2015-02-10 MED ORDER — LACTATED RINGERS IV SOLN: INTRAVENOUS | Status: DC

## 2015-02-10 MED ORDER — PANTOPRAZOLE SODIUM 40 MG PO TBEC: 40.0000 mg | DELAYED_RELEASE_TABLET | Freq: Every day | ORAL | Status: DC

## 2015-02-10 MED ORDER — HEPARIN SODIUM (PORCINE) 1000 UNIT/ML IJ SOLN
INTRAMUSCULAR | Status: DC | PRN
  Administered 2015-02-10: 25000 [IU] via INTRAVENOUS

## 2015-02-10 MED ORDER — HEPARIN SODIUM (PORCINE) 1000 UNIT/ML IJ SOLN
INTRAMUSCULAR | Status: AC
  Filled 2015-02-10: qty 1

## 2015-02-10 MED ORDER — CETYLPYRIDINIUM CHLORIDE 0.05 % MT LIQD: 7.0000 mL | Freq: Four times a day (QID) | OROMUCOSAL | Status: DC

## 2015-02-10 MED ORDER — AMIODARONE HCL IN DEXTROSE 360-4.14 MG/200ML-% IV SOLN
30.0000 mg/h | INTRAVENOUS | Status: DC
  Administered 2015-02-11: 30 mg/h via INTRAVENOUS
  Filled 2015-02-10 (×3): qty 200

## 2015-02-10 MED ORDER — ACETAMINOPHEN 500 MG PO TABS
1000.0000 mg | ORAL_TABLET | Freq: Four times a day (QID) | ORAL | Status: DC
  Administered 2015-02-11 (×2): 1000 mg via ORAL
  Filled 2015-02-10 (×6): qty 2

## 2015-02-10 MED ORDER — SODIUM CHLORIDE 0.9 % IV SOLN
INTRAVENOUS | Status: DC
Start: 2015-02-10 — End: 2015-02-11
  Administered 2015-02-10: 13:00:00 via INTRAVENOUS

## 2015-02-10 MED ORDER — DOCUSATE SODIUM 100 MG PO CAPS
200.0000 mg | ORAL_CAPSULE | Freq: Every day | ORAL | Status: DC
  Administered 2015-02-11: 200 mg via ORAL
  Filled 2015-02-10: qty 2

## 2015-02-10 MED ORDER — VECURONIUM BROMIDE 10 MG IV SOLR
INTRAVENOUS | Status: AC
  Filled 2015-02-10: qty 10

## 2015-02-10 MED ORDER — METOPROLOL TARTRATE 1 MG/ML IV SOLN: 2.5000 mg | INTRAVENOUS | Status: DC | PRN

## 2015-02-10 MED ORDER — MORPHINE SULFATE 2 MG/ML IJ SOLN
1.0000 mg | INTRAMUSCULAR | Status: DC | PRN
  Administered 2015-02-10: 2 mg via INTRAVENOUS

## 2015-02-10 MED ORDER — THROMBIN 20000 UNITS EX SOLR
CUTANEOUS | Status: AC
  Filled 2015-02-10: qty 20000

## 2015-02-10 MED ORDER — AMIODARONE HCL IN DEXTROSE 360-4.14 MG/200ML-% IV SOLN
60.0000 mg/h | INTRAVENOUS | Status: AC
  Administered 2015-02-10 (×2): 60 mg/h via INTRAVENOUS
  Filled 2015-02-10: qty 200

## 2015-02-10 MED ORDER — ALBUMIN HUMAN 5 % IV SOLN
250.0000 mL | INTRAVENOUS | Status: DC | PRN
  Administered 2015-02-10 (×2): 250 mL via INTRAVENOUS

## 2015-02-10 MED ORDER — AMIODARONE LOAD VIA INFUSION
150.0000 mg | Freq: Once | INTRAVENOUS | Status: AC
  Administered 2015-02-10: 150 mg via INTRAVENOUS
  Filled 2015-02-10: qty 83.34

## 2015-02-10 MED ORDER — AMIODARONE HCL IN DEXTROSE 360-4.14 MG/200ML-% IV SOLN
INTRAVENOUS | Status: AC
  Filled 2015-02-10: qty 200

## 2015-02-10 MED ORDER — METOPROLOL TARTRATE 25 MG/10 ML ORAL SUSPENSION
12.5000 mg | Freq: Two times a day (BID) | ORAL | Status: DC
  Filled 2015-02-10 (×3): qty 5

## 2015-02-10 MED ORDER — CHLORHEXIDINE GLUCONATE 0.12 % MT SOLN
15.0000 mL | Freq: Two times a day (BID) | OROMUCOSAL | Status: DC
Start: 2015-02-10 — End: 2015-02-10

## 2015-02-10 MED ORDER — ACETAMINOPHEN 160 MG/5ML PO SOLN
1000.0000 mg | Freq: Four times a day (QID) | ORAL | Status: DC
  Filled 2015-02-10: qty 40

## 2015-02-10 MED ORDER — LIDOCAINE HCL (CARDIAC) 20 MG/ML IV SOLN
INTRAVENOUS | Status: DC | PRN
  Administered 2015-02-10: 30 mg via INTRAVENOUS

## 2015-02-10 MED ORDER — LACTATED RINGERS IV SOLN
INTRAVENOUS | Status: DC
  Administered 2015-02-10: 14:00:00 via INTRAVENOUS

## 2015-02-10 MED ORDER — SODIUM CHLORIDE 0.9 % IV SOLN: 250.0000 mL | INTRAVENOUS | Status: DC

## 2015-02-10 MED ORDER — MAGNESIUM SULFATE 4 GM/100ML IV SOLN
4.0000 g | Freq: Once | INTRAVENOUS | Status: AC
  Administered 2015-02-10: 4 g via INTRAVENOUS
  Filled 2015-02-10: qty 100

## 2015-02-10 MED ORDER — ACETAMINOPHEN 650 MG RE SUPP: 650.0000 mg | Freq: Once | RECTAL | Status: AC

## 2015-02-10 MED ORDER — PROTAMINE SULFATE 10 MG/ML IV SOLN
INTRAVENOUS | Status: DC | PRN
  Administered 2015-02-10 (×2): 100 mg via INTRAVENOUS

## 2015-02-10 MED ORDER — ROCURONIUM BROMIDE 50 MG/5ML IV SOLN
INTRAVENOUS | Status: AC
  Filled 2015-02-10: qty 1

## 2015-02-10 MED ORDER — THROMBIN 20000 UNITS EX SOLR
CUTANEOUS | Status: DC | PRN
  Administered 2015-02-10: 20000 [IU] via TOPICAL

## 2015-02-10 MED ORDER — ALBUMIN HUMAN 5 % IV SOLN
INTRAVENOUS | Status: DC | PRN
  Administered 2015-02-10 (×2): via INTRAVENOUS

## 2015-02-10 MED ORDER — AMIODARONE HCL IN DEXTROSE 360-4.14 MG/200ML-% IV SOLN
INTRAVENOUS | Status: DC | PRN
  Administered 2015-02-10 (×2): 30 mg/h via INTRAVENOUS

## 2015-02-10 MED ORDER — ARTIFICIAL TEARS OP OINT
TOPICAL_OINTMENT | OPHTHALMIC | Status: AC
  Filled 2015-02-10: qty 3.5

## 2015-02-10 MED ORDER — FAMOTIDINE IN NACL 20-0.9 MG/50ML-% IV SOLN
20.0000 mg | Freq: Two times a day (BID) | INTRAVENOUS | Status: DC
  Administered 2015-02-10: 20 mg via INTRAVENOUS

## 2015-02-10 MED ORDER — LACTATED RINGERS IV SOLN
INTRAVENOUS | Status: DC | PRN
  Administered 2015-02-10: 07:00:00 via INTRAVENOUS

## 2015-02-10 MED ORDER — PROPOFOL 10 MG/ML IV BOLUS
INTRAVENOUS | Status: AC
  Filled 2015-02-10: qty 20

## 2015-02-10 MED ORDER — FENTANYL CITRATE 0.05 MG/ML IJ SOLN
INTRAMUSCULAR | Status: AC
  Filled 2015-02-10: qty 5

## 2015-02-10 MED ORDER — ROCURONIUM BROMIDE 100 MG/10ML IV SOLN
INTRAVENOUS | Status: DC | PRN
Start: 2015-02-10 — End: 2015-02-10
  Administered 2015-02-10: 100 mg via INTRAVENOUS
  Administered 2015-02-10: 50 mg via INTRAVENOUS

## 2015-02-10 MED ORDER — MORPHINE SULFATE 2 MG/ML IJ SOLN
2.0000 mg | INTRAMUSCULAR | Status: DC | PRN
  Administered 2015-02-10: 2 mg via INTRAVENOUS
  Administered 2015-02-11: 4 mg via INTRAVENOUS
  Administered 2015-02-11: 2 mg via INTRAVENOUS
  Administered 2015-02-11 (×3): 4 mg via INTRAVENOUS
  Filled 2015-02-10: qty 2
  Filled 2015-02-10: qty 1
  Filled 2015-02-10: qty 2
  Filled 2015-02-10: qty 1
  Filled 2015-02-10 (×3): qty 2

## 2015-02-10 MED ORDER — FENTANYL CITRATE 0.05 MG/ML IJ SOLN
INTRAMUSCULAR | Status: DC | PRN
  Administered 2015-02-10: 650 ug via INTRAVENOUS
  Administered 2015-02-10: 100 ug via INTRAVENOUS
  Administered 2015-02-10: 150 ug via INTRAVENOUS
  Administered 2015-02-10: 50 ug via INTRAVENOUS
  Administered 2015-02-10: 200 ug via INTRAVENOUS
  Administered 2015-02-10: 100 ug via INTRAVENOUS

## 2015-02-10 MED ORDER — TRAMADOL HCL 50 MG PO TABS
50.0000 mg | ORAL_TABLET | ORAL | Status: DC | PRN
  Administered 2015-02-11: 100 mg via ORAL
  Filled 2015-02-10: qty 2

## 2015-02-10 MED ORDER — OXYCODONE HCL 5 MG PO TABS
5.0000 mg | ORAL_TABLET | ORAL | Status: DC | PRN
  Administered 2015-02-11 (×6): 10 mg via ORAL
  Filled 2015-02-10 (×6): qty 2

## 2015-02-10 MED ORDER — MIDAZOLAM HCL 10 MG/2ML IJ SOLN
INTRAMUSCULAR | Status: AC
  Filled 2015-02-10: qty 2

## 2015-02-10 MED ORDER — MIDAZOLAM HCL 5 MG/5ML IJ SOLN
INTRAMUSCULAR | Status: DC | PRN
  Administered 2015-02-10: 2 mg via INTRAVENOUS
  Administered 2015-02-10: 4 mg via INTRAVENOUS
  Administered 2015-02-10 (×4): 1 mg via INTRAVENOUS
  Administered 2015-02-10: 2 mg via INTRAVENOUS

## 2015-02-10 MED ORDER — SODIUM BICARBONATE 8.4 % IV SOLN
50.0000 meq | Freq: Once | INTRAVENOUS | Status: AC
  Administered 2015-02-10: 50 meq via INTRAVENOUS

## 2015-02-10 MED ORDER — BISACODYL 10 MG RE SUPP: 10.0000 mg | Freq: Every day | RECTAL | Status: DC

## 2015-02-10 MED ORDER — MIDAZOLAM HCL 2 MG/2ML IJ SOLN
INTRAMUSCULAR | Status: AC
  Filled 2015-02-10: qty 2

## 2015-02-10 MED ORDER — AMIODARONE HCL IN DEXTROSE 360-4.14 MG/200ML-% IV SOLN
30.0000 mg/h | INTRAVENOUS | Status: DC
  Administered 2015-02-10: 30 mg/h via INTRAVENOUS
  Filled 2015-02-10 (×3): qty 200

## 2015-02-10 MED ORDER — PHENYLEPHRINE HCL 10 MG/ML IJ SOLN
0.0000 ug/min | INTRAVENOUS | Status: DC
  Administered 2015-02-10: 35 ug/min via INTRAVENOUS
  Administered 2015-02-11 (×2): 45 ug/min via INTRAVENOUS
  Filled 2015-02-10 (×4): qty 2

## 2015-02-10 MED ORDER — DEXTROSE 5 % IV SOLN
1.5000 g | Freq: Two times a day (BID) | INTRAVENOUS | Status: DC
  Administered 2015-02-10 – 2015-02-11 (×2): 1.5 g via INTRAVENOUS
  Filled 2015-02-10 (×4): qty 1.5

## 2015-02-10 MED ORDER — VANCOMYCIN HCL IN DEXTROSE 1-5 GM/200ML-% IV SOLN
1000.0000 mg | Freq: Once | INTRAVENOUS | Status: AC
  Administered 2015-02-10: 1000 mg via INTRAVENOUS
  Filled 2015-02-10: qty 200

## 2015-02-10 MED ORDER — SODIUM CHLORIDE 0.9 % IV SOLN
INTRAVENOUS | Status: DC
  Administered 2015-02-11: 1.2 [IU]/h via INTRAVENOUS
  Filled 2015-02-10 (×2): qty 2.5

## 2015-02-10 MED ORDER — SODIUM CHLORIDE 0.9 % IJ SOLN
INTRAMUSCULAR | Status: AC
  Filled 2015-02-10: qty 20

## 2015-02-10 MED ORDER — SODIUM CHLORIDE 0.9 % IV BOLUS (SEPSIS)
250.0000 mL | Freq: Once | INTRAVENOUS | Status: AC
  Administered 2015-02-10: 250 mL via INTRAVENOUS

## 2015-02-10 MED ORDER — SODIUM BICARBONATE 8.4 % IV SOLN
50.0000 meq | Freq: Once | INTRAVENOUS | Status: AC
Start: 2015-02-10 — End: 2015-02-10
  Administered 2015-02-10: 50 meq via INTRAVENOUS

## 2015-02-10 MED ORDER — STERILE WATER FOR INJECTION IJ SOLN
INTRAMUSCULAR | Status: AC
  Filled 2015-02-10: qty 10

## 2015-02-10 MED ORDER — METOCLOPRAMIDE HCL 5 MG/ML IJ SOLN
10.0000 mg | Freq: Four times a day (QID) | INTRAMUSCULAR | Status: DC
  Administered 2015-02-10 – 2015-02-11 (×3): 10 mg via INTRAVENOUS
  Filled 2015-02-10 (×2): qty 2

## 2015-02-10 MED ORDER — METOPROLOL TARTRATE 12.5 MG HALF TABLET
12.5000 mg | ORAL_TABLET | Freq: Two times a day (BID) | ORAL | Status: DC
  Administered 2015-02-11: 12.5 mg via ORAL
  Filled 2015-02-10 (×3): qty 1

## 2015-02-10 MED ORDER — SODIUM CHLORIDE 0.9 % IV SOLN
20.0000 mg | INTRAVENOUS | Status: DC | PRN
  Administered 2015-02-10: 20 ug/min via INTRAVENOUS

## 2015-02-10 MED ORDER — SODIUM CHLORIDE 0.45 % IV SOLN
INTRAVENOUS | Status: DC | PRN
  Administered 2015-02-10: 13:00:00 via INTRAVENOUS

## 2015-02-10 MED ORDER — HEMOSTATIC AGENTS (NO CHARGE) OPTIME
TOPICAL | Status: DC | PRN
  Administered 2015-02-10: 1 via TOPICAL

## 2015-02-10 MED ORDER — ONDANSETRON HCL 4 MG/2ML IJ SOLN: 4.0000 mg | Freq: Four times a day (QID) | INTRAMUSCULAR | Status: DC | PRN

## 2015-02-10 MED ORDER — LACTATED RINGERS IV SOLN
INTRAVENOUS | Status: DC | PRN
  Administered 2015-02-10 (×2): via INTRAVENOUS

## 2015-02-10 MED ORDER — THROMBIN 20000 UNITS EX SOLR
OROMUCOSAL | Status: DC | PRN
  Administered 2015-02-10 (×3): 4 mL via TOPICAL

## 2015-02-10 MED ORDER — ARTIFICIAL TEARS OP OINT
TOPICAL_OINTMENT | OPHTHALMIC | Status: DC | PRN
  Administered 2015-02-10: 1 via OPHTHALMIC

## 2015-02-10 MED ORDER — SODIUM CHLORIDE 0.9 % IJ SOLN
3.0000 mL | Freq: Two times a day (BID) | INTRAMUSCULAR | Status: DC
  Administered 2015-02-11 (×2): 3 mL via INTRAVENOUS

## 2015-02-10 MED ORDER — SODIUM CHLORIDE 0.9 % IJ SOLN: 3.0000 mL | INTRAMUSCULAR | Status: DC | PRN

## 2015-02-10 MED ORDER — BISACODYL 5 MG PO TBEC
10.0000 mg | DELAYED_RELEASE_TABLET | Freq: Every day | ORAL | Status: DC
  Administered 2015-02-11: 10 mg via ORAL
  Filled 2015-02-10: qty 2

## 2015-02-10 MED ORDER — EPHEDRINE SULFATE 50 MG/ML IJ SOLN
INTRAMUSCULAR | Status: AC
Start: 2015-02-10 — End: 2015-02-10
  Filled 2015-02-10: qty 1

## 2015-02-10 MED ORDER — PROTAMINE SULFATE 10 MG/ML IV SOLN
INTRAVENOUS | Status: AC
  Filled 2015-02-10: qty 25

## 2015-02-10 MED ORDER — LACTATED RINGERS IV SOLN: 500.0000 mL | Freq: Once | INTRAVENOUS | Status: AC | PRN

## 2015-02-10 MED ORDER — 0.9 % SODIUM CHLORIDE (POUR BTL) OPTIME
TOPICAL | Status: DC | PRN
  Administered 2015-02-10: 6000 mL

## 2015-02-10 MED ORDER — ASPIRIN 81 MG PO CHEW: 324.0000 mg | CHEWABLE_TABLET | Freq: Every day | ORAL | Status: DC

## 2015-02-10 MED ORDER — LIDOCAINE HCL (CARDIAC) 20 MG/ML IV SOLN
INTRAVENOUS | Status: AC
Start: 2015-02-10 — End: 2015-02-10
  Filled 2015-02-10: qty 5

## 2015-02-10 MED ORDER — INSULIN REGULAR BOLUS VIA INFUSION
0.0000 [IU] | Freq: Three times a day (TID) | INTRAVENOUS | Status: DC
  Filled 2015-02-10: qty 10

## 2015-02-10 MED ORDER — POTASSIUM CHLORIDE 10 MEQ/50ML IV SOLN: 10.0000 meq | INTRAVENOUS | Status: AC

## 2015-02-10 MED ORDER — NITROGLYCERIN IN D5W 200-5 MCG/ML-% IV SOLN: 0.0000 ug/min | INTRAVENOUS | Status: DC

## 2015-02-10 MED ORDER — ACETAMINOPHEN 160 MG/5ML PO SOLN
650.0000 mg | Freq: Once | ORAL | Status: AC
  Administered 2015-02-10: 650 mg

## 2015-02-10 MED ORDER — ASPIRIN EC 325 MG PO TBEC
325.0000 mg | DELAYED_RELEASE_TABLET | Freq: Every day | ORAL | Status: DC
  Administered 2015-02-11: 325 mg via ORAL
  Filled 2015-02-10: qty 1

## 2015-02-10 MED FILL — Mannitol IV Soln 20%: INTRAVENOUS | Qty: 500 | Status: AC

## 2015-02-10 MED FILL — Heparin Sodium (Porcine) Inj 1000 Unit/ML: INTRAMUSCULAR | Qty: 20 | Status: AC

## 2015-02-10 MED FILL — Sodium Chloride IV Soln 0.9%: INTRAVENOUS | Qty: 2000 | Status: AC

## 2015-02-10 MED FILL — Electrolyte-R (PH 7.4) Solution: INTRAVENOUS | Qty: 4000 | Status: AC

## 2015-02-10 MED FILL — Sodium Bicarbonate IV Soln 8.4%: INTRAVENOUS | Qty: 50 | Status: AC

## 2015-02-10 MED FILL — Lidocaine HCl IV Inj 20 MG/ML: INTRAVENOUS | Qty: 5 | Status: AC

## 2015-02-10 SURGICAL SUPPLY — 104 items
BAG DECANTER FOR FLEXI CONT (MISCELLANEOUS) ×3 IMPLANT
BANDAGE ELASTIC 4 VELCRO ST LF (GAUZE/BANDAGES/DRESSINGS) ×3 IMPLANT
BANDAGE ELASTIC 6 VELCRO ST LF (GAUZE/BANDAGES/DRESSINGS) ×3 IMPLANT
BASKET HEART (ORDER IN 25'S) (MISCELLANEOUS) ×1
BASKET HEART (ORDER IN 25S) (MISCELLANEOUS) ×2 IMPLANT
BLADE STERNUM SYSTEM 6 (BLADE) ×3 IMPLANT
BLADE SURG 11 STRL SS (BLADE) ×3 IMPLANT
BNDG GAUZE ELAST 4 BULKY (GAUZE/BANDAGES/DRESSINGS) ×3 IMPLANT
CANISTER SUCTION 2500CC (MISCELLANEOUS) ×3 IMPLANT
CANNULA ARTERIAL NVNT 3/8 20FR (MISCELLANEOUS) IMPLANT
CANNULA ARTERIAL VENT 3/8 20FR (CANNULA) ×3 IMPLANT
CATH ROBINSON RED A/P 18FR (CATHETERS) ×6 IMPLANT
CATH THORACIC 28FR (CATHETERS) ×3 IMPLANT
CATH THORACIC 36FR (CATHETERS) ×3 IMPLANT
CATH THORACIC 36FR RT ANG (CATHETERS) ×3 IMPLANT
CLIP TI MEDIUM 24 (CLIP) IMPLANT
CLIP TI WIDE RED SMALL 24 (CLIP) ×3 IMPLANT
COVER MAYO STAND STRL (DRAPES) ×3 IMPLANT
COVER SURGICAL LIGHT HANDLE (MISCELLANEOUS) ×3 IMPLANT
CRADLE DONUT ADULT HEAD (MISCELLANEOUS) ×3 IMPLANT
DERMABOND ADVANCED (GAUZE/BANDAGES/DRESSINGS) ×1
DERMABOND ADVANCED .7 DNX12 (GAUZE/BANDAGES/DRESSINGS) ×2 IMPLANT
DRAPE CARDIOVASCULAR INCISE (DRAPES) ×1
DRAPE SLUSH/WARMER DISC (DRAPES) ×3 IMPLANT
DRAPE SRG 135X102X78XABS (DRAPES) ×2 IMPLANT
DRSG COVADERM 4X14 (GAUZE/BANDAGES/DRESSINGS) ×3 IMPLANT
ELECT CAUTERY BLADE 6.4 (BLADE) ×3 IMPLANT
ELECT REM PT RETURN 9FT ADLT (ELECTROSURGICAL) ×6
ELECTRODE REM PT RTRN 9FT ADLT (ELECTROSURGICAL) ×4 IMPLANT
GAUZE SPONGE 4X4 12PLY STRL (GAUZE/BANDAGES/DRESSINGS) ×6 IMPLANT
GLOVE BIO SURGEON STRL SZ 6 (GLOVE) ×12 IMPLANT
GLOVE BIO SURGEON STRL SZ 6.5 (GLOVE) IMPLANT
GLOVE BIO SURGEON STRL SZ7 (GLOVE) IMPLANT
GLOVE BIO SURGEON STRL SZ7.5 (GLOVE) ×3 IMPLANT
GLOVE BIOGEL PI IND STRL 6 (GLOVE) ×4 IMPLANT
GLOVE BIOGEL PI IND STRL 6.5 (GLOVE) ×2 IMPLANT
GLOVE BIOGEL PI IND STRL 7.0 (GLOVE) ×6 IMPLANT
GLOVE BIOGEL PI INDICATOR 6 (GLOVE) ×2
GLOVE BIOGEL PI INDICATOR 6.5 (GLOVE) ×1
GLOVE BIOGEL PI INDICATOR 7.0 (GLOVE) ×3
GLOVE EUDERMIC 7 POWDERFREE (GLOVE) ×6 IMPLANT
GLOVE ORTHO TXT STRL SZ7.5 (GLOVE) IMPLANT
GLOVE SS PI 9.0 STRL (GLOVE) ×3 IMPLANT
GOWN STRL REUS W/ TWL LRG LVL3 (GOWN DISPOSABLE) ×8 IMPLANT
GOWN STRL REUS W/ TWL XL LVL3 (GOWN DISPOSABLE) ×2 IMPLANT
GOWN STRL REUS W/TWL LRG LVL3 (GOWN DISPOSABLE) ×4
GOWN STRL REUS W/TWL XL LVL3 (GOWN DISPOSABLE) ×1
HEMOSTAT POWDER SURGIFOAM 1G (HEMOSTASIS) ×9 IMPLANT
HEMOSTAT SURGICEL 2X14 (HEMOSTASIS) ×3 IMPLANT
INSERT FOGARTY 61MM (MISCELLANEOUS) IMPLANT
INSERT FOGARTY XLG (MISCELLANEOUS) IMPLANT
KIT BASIN OR (CUSTOM PROCEDURE TRAY) ×3 IMPLANT
KIT CATH CPB BARTLE (MISCELLANEOUS) ×3 IMPLANT
KIT ROOM TURNOVER OR (KITS) ×3 IMPLANT
KIT SUCTION CATH 14FR (SUCTIONS) ×6 IMPLANT
KIT VASOVIEW W/TROCAR VH 2000 (KITS) ×3 IMPLANT
NS IRRIG 1000ML POUR BTL (IV SOLUTION) ×15 IMPLANT
PACK OPEN HEART (CUSTOM PROCEDURE TRAY) ×3 IMPLANT
PAD ARMBOARD 7.5X6 YLW CONV (MISCELLANEOUS) ×6 IMPLANT
PAD ELECT DEFIB RADIOL ZOLL (MISCELLANEOUS) ×3 IMPLANT
PENCIL BUTTON HOLSTER BLD 10FT (ELECTRODE) ×3 IMPLANT
PUNCH AORTIC ROTATE  4.5MM 8IN (MISCELLANEOUS) ×3 IMPLANT
PUNCH AORTIC ROTATE 4.0MM (MISCELLANEOUS) IMPLANT
PUNCH AORTIC ROTATE 4.5MM 8IN (MISCELLANEOUS) ×3 IMPLANT
PUNCH AORTIC ROTATE 5MM 8IN (MISCELLANEOUS) IMPLANT
SET CARDIOPLEGIA MPS 5001102 (MISCELLANEOUS) ×3 IMPLANT
SOLUTION ANTI FOG 6CC (MISCELLANEOUS) ×3 IMPLANT
SPONGE INTESTINAL PEANUT (DISPOSABLE) IMPLANT
SPONGE LAP 18X18 X RAY DECT (DISPOSABLE) IMPLANT
SPONGE LAP 4X18 X RAY DECT (DISPOSABLE) ×3 IMPLANT
SUT BONE WAX W31G (SUTURE) ×3 IMPLANT
SUT MNCRL AB 4-0 PS2 18 (SUTURE) ×3 IMPLANT
SUT PROLENE 3 0 SH DA (SUTURE) IMPLANT
SUT PROLENE 3 0 SH1 36 (SUTURE) ×3 IMPLANT
SUT PROLENE 4 0 RB 1 (SUTURE)
SUT PROLENE 4 0 SH DA (SUTURE) IMPLANT
SUT PROLENE 4-0 RB1 .5 CRCL 36 (SUTURE) IMPLANT
SUT PROLENE 5 0 C 1 36 (SUTURE) IMPLANT
SUT PROLENE 6 0 C 1 30 (SUTURE) IMPLANT
SUT PROLENE 7 0 BV 1 (SUTURE) IMPLANT
SUT PROLENE 7 0 BV1 MDA (SUTURE) ×3 IMPLANT
SUT PROLENE 8 0 BV175 6 (SUTURE) IMPLANT
SUT SILK  1 MH (SUTURE)
SUT SILK 1 MH (SUTURE) IMPLANT
SUT STEEL STERNAL CCS#1 18IN (SUTURE) IMPLANT
SUT STEEL SZ 6 DBL 3X14 BALL (SUTURE) IMPLANT
SUT VIC AB 1 CTX 36 (SUTURE) ×2
SUT VIC AB 1 CTX36XBRD ANBCTR (SUTURE) ×4 IMPLANT
SUT VIC AB 2-0 CT1 27 (SUTURE) ×1
SUT VIC AB 2-0 CT1 TAPERPNT 27 (SUTURE) ×2 IMPLANT
SUT VIC AB 2-0 CTX 27 (SUTURE) IMPLANT
SUT VIC AB 3-0 SH 27 (SUTURE)
SUT VIC AB 3-0 SH 27X BRD (SUTURE) IMPLANT
SUT VIC AB 3-0 X1 27 (SUTURE) IMPLANT
SUT VICRYL 4-0 PS2 18IN ABS (SUTURE) IMPLANT
SUTURE E-PAK OPEN HEART (SUTURE) ×3 IMPLANT
SYSTEM SAHARA CHEST DRAIN ATS (WOUND CARE) ×3 IMPLANT
TAPE CLOTH SURG 4X10 WHT LF (GAUZE/BANDAGES/DRESSINGS) ×3 IMPLANT
TOWEL OR 17X24 6PK STRL BLUE (TOWEL DISPOSABLE) ×3 IMPLANT
TOWEL OR 17X26 10 PK STRL BLUE (TOWEL DISPOSABLE) ×3 IMPLANT
TRAY FOLEY IC TEMP SENS 16FR (CATHETERS) ×3 IMPLANT
TUBING INSUFFLATION (TUBING) ×3 IMPLANT
UNDERPAD 30X30 INCONTINENT (UNDERPADS AND DIAPERS) ×3 IMPLANT
WATER STERILE IRR 1000ML POUR (IV SOLUTION) ×6 IMPLANT

## 2015-02-10 NOTE — Anesthesia Procedure Notes (Signed)
Procedure Name: Intubation Date/Time: 02/10/2015 7:51 AM Performed by: Neldon Newport Pre-anesthesia Checklist: Patient being monitored, Emergency Drugs available, Timeout performed, Patient identified and Suction available Patient Re-evaluated:Patient Re-evaluated prior to inductionOxygen Delivery Method: Circle system utilized Preoxygenation: Pre-oxygenation with 100% oxygen Intubation Type: IV induction Ventilation: Mask ventilation without difficulty Laryngoscope Size: Mac and 3 Grade View: Grade I Tube type: Oral Tube size: 8.0 mm Number of attempts: 1 Placement Confirmation: positive ETCO2,  ETT inserted through vocal cords under direct vision and breath sounds checked- equal and bilateral Secured at: 22 cm Tube secured with: Tape Dental Injury: Teeth and Oropharynx as per pre-operative assessment

## 2015-02-10 NOTE — Procedures (Signed)
Extubation Procedure Note  Patient Details:   Name: Michael Jefferson DOB: May 06, 1941 MRN: [id number]   Airway Documentation:     Evaluation  O2 sats: stable throughout Complications: No apparent complications Patient did tolerate procedure well. Bilateral Breath Sounds: Clear   Yes patient able to state full name and his location.  Patient had a cuff leak prior to extubation.  IS started patient did 250-500cc x 10 good cough  Ned Grace 02/10/2015, 4:50 PM

## 2015-02-10 NOTE — Progress Notes (Signed)
  Amiodarone Drug - Drug Interaction Consult Note  Recommendations: Reduce dose of simvastatin to 20 mg daily if Amiodarone is to be continued long-term, or change to atorvastatin.   Amiodarone is metabolized by the cytochrome P450 system and therefore has the potential to cause many drug interactions. Amiodarone has an average plasma half-life of 50 days (range 20 to 100 days).   There is potential for drug interactions to occur several weeks or months after stopping treatment and the onset of drug interactions may be slow after initiating amiodarone.   [x]  Statins: Increased risk of myopathy. Simvastatin- restrict dose to 20mg  daily. Other statins: counsel patients to report any muscle pain or weakness immediately.  []  Anticoagulants: Amiodarone can increase anticoagulant effect. Consider warfarin dose reduction. Patients should be monitored closely and the dose of anticoagulant altered accordingly, remembering that amiodarone levels take several weeks to stabilize.  []  Antiepileptics: Amiodarone can increase plasma concentration of phenytoin, the dose should be reduced. Note that small changes in phenytoin dose can result in large changes in levels. Monitor patient and counsel on signs of toxicity.  [x]  Beta blockers: increased risk of bradycardia, AV block and myocardial depression. Sotalol - avoid concomitant use.  []   Calcium channel blockers (diltiazem and verapamil): increased risk of bradycardia, AV block and myocardial depression.  []   Cyclosporine: Amiodarone increases levels of cyclosporine. Reduced dose of cyclosporine is recommended.  []  Digoxin dose should be halved when amiodarone is started.  []  Diuretics: increased risk of cardiotoxicity if hypokalemia occurs.  []  Oral hypoglycemic agents (glyburide, glipizide, glimepiride): increased risk of hypoglycemia. Patient's glucose levels should be monitored closely when initiating amiodarone therapy.   []  Drugs that prolong  the QT interval:  Torsades de pointes risk may be increased with concurrent use - avoid if possible.  Monitor QTc, also keep magnesium/potassium WNL if concurrent therapy can't be avoided. Marland Kitchen Antibiotics: e.g. fluoroquinolones, erythromycin. . Antiarrhythmics: e.g. quinidine, procainamide, disopyramide, sotalol. . Antipsychotics: e.g. phenothiazines, haloperidol.  . Lithium, tricyclic antidepressants, and methadone. Thank You,  Narda Bonds  02/10/2015 12:29 AM

## 2015-02-10 NOTE — Brief Op Note (Signed)
02/08/2015 - 02/10/2015  10:56 AM  PATIENT:  Michael Jefferson  74 y.o. male  PRE-OPERATIVE DIAGNOSIS:  CAD  POST-OPERATIVE DIAGNOSIS:  CAD  PROCEDURE:  Procedure(s):  CORONARY ARTERY BYPASS GRAFTING x 3 -LIMA to LAD -SVG to DIAGONAL -SVG RAMUS INTERMEDIATE  ENDOSCOPIC SAPHENOUS VEIN HARVEST RIGHT LEG  TRANSESOPHAGEAL ECHOCARDIOGRAM (TEE) (N/A)  SURGEON:  Surgeon(s) and Role:    * Gaye Pollack, MD - Primary  PHYSICIAN ASSISTANT: Savas Elvin PA-C  ANESTHESIA:   general  EBL:  Total I/O In: -  Out: 150 [Urine:150]  BLOOD ADMINISTERED: CELLSAVER  DRAINS: Left Pleural Chest Tubes, Mediastinal Chest Tubes   LOCAL MEDICATIONS USED:  NONE  SPECIMEN:  No Specimen  DISPOSITION OF SPECIMEN:  N/A  COUNTS:  YES  TOURNIQUET:  * No tourniquets in log *  DICTATION: .Dragon Dictation  PLAN OF CARE: Admit to inpatient   PATIENT DISPOSITION:  ICU - intubated and hemodynamically stable.   Delay start of Pharmacological VTE agent (>24hrs) due to surgical blood loss or risk of bleeding: yes

## 2015-02-10 NOTE — Progress Notes (Addendum)
      [address]       [place] [address]             [phone]        S/P CABG x 3  -Hemodynamically stable on Neo gtt -extubated uneventfully, patient awake complains of some soreness and being thirsty -Adequate U/O 45-175 ml/hr -Chest tube output low - continue current care  patient examined and medical record reviewed,agree with above note. Tharon Aquas Trigt III 02/11/2015

## 2015-02-10 NOTE — Anesthesia Preprocedure Evaluation (Addendum)
Anesthesia Evaluation  Patient identified by MRN, date of birth, ID band Patient awake    Reviewed: Allergy & Precautions, H&P , NPO status , Patient's Chart, lab work & pertinent test results  Airway Mallampati: II  TM Distance: >3 FB Neck ROM: full    Dental  (+) Teeth Intact, Dental Advidsory Given, Caps   Pulmonary sleep apnea ,          Cardiovascular + CAD     Neuro/Psych    GI/Hepatic   Endo/Other    Renal/GU Renal disease     Musculoskeletal   Abdominal   Peds  Hematology   Anesthesia Other Findings   Reproductive/Obstetrics                            Anesthesia Physical Anesthesia Plan  ASA: III  Anesthesia Plan: General   Post-op Pain Management:    Induction: Intravenous  Airway Management Planned: Oral ETT  Additional Equipment: Arterial line, CVP, PA Cath and TEE  Intra-op Plan:   Post-operative Plan: Post-operative intubation/ventilation  Informed Consent: I have reviewed the patients History and Physical, chart, labs and discussed the procedure including the risks, benefits and alternatives for the proposed anesthesia with the patient or authorized representative who has indicated his/her understanding and acceptance.   Dental Advisory Given  Plan Discussed with: CRNA, Anesthesiologist and Surgeon  Anesthesia Plan Comments:        Anesthesia Quick Evaluation

## 2015-02-10 NOTE — Transfer of Care (Signed)
Immediate Anesthesia Transfer of Care Note  Patient: Michael Jefferson  Procedure(s) Performed: Procedure(s): CORONARY ARTERY BYPASS GRAFTING (CABG), ON PUMP, TIMES THREE, USING LEFT INTERNAL MAMMARY ARTERY, RIGHT GREATER SAPHENOUS VEIN HARVESTED ENDOSCOPICALLY (N/A) TRANSESOPHAGEAL ECHOCARDIOGRAM (TEE) (N/A)  Patient Location: SICU  Anesthesia Type:General  Level of Consciousness: awake, alert  and oriented  Airway & Oxygen Therapy: Patient placed on Ventilator (see vital sign flow sheet for setting)  Post-op Assessment: Report given to RN and Post -op Vital signs reviewed and stable  Post vital signs: Reviewed and stable  Last Vitals:  Filed Vitals:   02/10/15 0425  BP: 122/81  Pulse: 70  Temp: 36.7 C  Resp: 18    Complications: No apparent anesthesia complications

## 2015-02-10 NOTE — Op Note (Signed)
CARDIOVASCULAR SURGERY OPERATIVE NOTE  02/10/2015  Surgeon:  Gaye Pollack, MD  First Assistant: Ellwood Handler,  PA-C   Preoperative Diagnosis:  Severe multi-vessel coronary artery disease   Postoperative Diagnosis:  Same   Procedure:  1. Median Sternotomy 2. Extracorporeal circulation 3.   Coronary artery bypass grafting x 3   Left internal mammary graft to the LAD  SVG to diagonal  SVG to Ramus  4.   Endoscopic vein harvest from the right leg   Anesthesia:  General Endotracheal   Clinical History/Surgical Indication:   The patient is a 74 year old gentleman with no prior history of heart disease who presented to [facility] with an acute NSTEMI with a troponin of 0.8. Peak was 1.0. He had recurrent chest pain and caht showed a high grade proximal LAD stenosis. There was also ostial and proximal stenosis of the large high diagonal branch and Ramus. The LCX was small. The RCA had no disease. LV function was preserved. He was transferred to [facility] for CABG. I discussed the operative procedure with the patient and family including alternatives, benefits and risks; including but not limited to bleeding, blood transfusion, infection, stroke, myocardial infarction, graft failure, heart block requiring a permanent pacemaker, organ dysfunction, and death.  Horton Finer understands and agrees to proceed.    Preparation:  The patient was seen in the preoperative holding area and the correct patient, correct operation were confirmed with the patient after reviewing the medical record and catheterization. The consent was signed by me. Preoperative antibiotics were given. A pulmonary arterial line and radial arterial line were placed by the anesthesia team. The patient was taken back to the operating room and positioned supine on the operating room table. After being placed under general  endotracheal anesthesia by the anesthesia team a foley catheter was placed. The neck, chest, abdomen, and both legs were prepped with betadine soap and solution and draped in the usual sterile manner. A surgical time-out was taken and the correct patient and operative procedure were confirmed with the nursing and anesthesia staff.   Cardiopulmonary Bypass:  A median sternotomy was performed. The pericardium was opened in the midline. Right ventricular function appeared normal. The ascending aorta was of normal size and had no palpable plaque. There were no contraindications to aortic cannulation or cross-clamping. The patient was fully systemically heparinized and the ACT was maintained > 400 sec. The proximal aortic arch was cannulated with a 20 F aortic cannula for arterial inflow. Venous cannulation was performed via the right atrial appendage using a two-staged venous cannula. An antegrade cardioplegia/vent cannula was inserted into the mid-ascending aorta. Aortic occlusion was performed with a single cross-clamp. Systemic cooling to 32 degrees Centigrade and topical cooling of the heart with iced saline were used. Hyperkalemic antegrade cold blood cardioplegia was used to induce diastolic arrest and was then given at about 20 minute intervals throughout the period of arrest to maintain myocardial temperature at or below 10 degrees centigrade. A temperature probe was inserted into the interventricular septum and an insulating pad was placed in the pericardium.   Left internal mammary harvest:  The left side of the sternum was retracted using the Rultract retractor. The left internal mammary artery was harvested as a pedicle graft. All side branches were clipped. It was a medium-sized vessel of good quality with excellent blood flow. It was ligated distally and divided. It was sprayed with topical papaverine solution to prevent vasospasm.   Endoscopic vein harvest:  The  right greater saphenous vein  was harvested endoscopically through a 2 cm incision medial to the right knee. It was harvested from the upper thigh to below the knee. It was a medium-sized vein of good quality. The side branches were all ligated with 4-0 silk ties.    Coronary arteries:  The coronary arteries were examined.   LAD:  Large vessel that was intramyocardial in its proximal half but visible distally where it was free of disease. The diagonal was a large vessel with no distal disease.  LCX:  Ramus was large and visible proximally, then became intramyocardial.   RCA:  No disease   Grafts:  1. LIMA to the LAD: 2.0 mm. It was sewn end to side using 8-0 prolene continuous suture. 2. SVG to diagonal:  1.75 mm. It was sewn end to side using 7-0 prolene continuous suture. 3. SVG to Ramus:  2.0 mm. It was sewn end to side using 7-0 prolene continuous suture.   The proximal vein graft anastomoses were performed to the mid-ascending aorta using continuous 6-0 prolene suture. Graft markers were placed around the proximal anastomoses.   Completion:  The patient was rewarmed to 37 degrees Centigrade. The clamp was removed from the LIMA pedicle and there was rapid warming of the septum and return of ventricular fibrillation. The crossclamp was removed with a time of 73 minutes. There was spontaneous return of sinus rhythm. The distal and proximal anastomoses were checked for hemostasis. The position of the grafts was satisfactory. Two temporary epicardial pacing wires were placed on the right atrium and two on the right ventricle. The patient was weaned from CPB without difficulty on no inotropes. CPB time was 92 minutes. Cardiac output was 4 LPM. Heparin was fully reversed with protamine and the aortic and venous cannulas removed. Hemostasis was achieved. Mediastinal and left pleural drainage tubes were placed. The sternum was closed with double #6 stainless steel wires. The fascia was closed with continuous # 1 vicryl  suture. The subcutaneous tissue was closed with 2-0 vicryl continuous suture. The skin was closed with 3-0 vicryl subcuticular suture. All sponge, needle, and instrument counts were reported correct at the end of the case. Dry sterile dressings were placed over the incisions and around the chest tubes which were connected to pleurevac suction. The patient was then transported to the surgical intensive care unit in critical but stable condition.

## 2015-02-10 NOTE — OR Nursing (Signed)
Second call to SICU at 1159.

## 2015-02-10 NOTE — Progress Notes (Signed)
Called by bedside RN for hypotension after bolus of Amiodarone was given and drip was started, after reviewing the patients chart and history, without finding any contraindications I have ordered a one time 250 NS bolus for the patient. Will continue to monitor.

## 2015-02-10 NOTE — Progress Notes (Signed)
02/10/2015 2:16 AM Since receiving the IV amiodarone, the patient's heart rate has now decreased to the 90s and 100s mostly but still in A Fib. Also, the patient's blood pressure has now decreased to 80/67.  The charge nurse was informed and he asked rapid response.  The charge nurse has informed to give the patient a 250 mL bolus.  Will continue to monitor the patient.

## 2015-02-10 NOTE — Anesthesia Postprocedure Evaluation (Signed)
  Anesthesia Post-op Note  Patient: Michael Jefferson  Procedure(s) Performed: Procedure(s): CORONARY ARTERY BYPASS GRAFTING (CABG), ON PUMP, TIMES THREE, USING LEFT INTERNAL MAMMARY ARTERY, RIGHT GREATER SAPHENOUS VEIN HARVESTED ENDOSCOPICALLY (N/A) TRANSESOPHAGEAL ECHOCARDIOGRAM (TEE) (N/A)  Patient Location: SICU  Anesthesia Type:General  Level of Consciousness: sedated and Patient remains intubated per anesthesia plan  Airway and Oxygen Therapy: Patient remains intubated per anesthesia plan and Patient placed on Ventilator (see vital sign flow sheet for setting)  Post-op Pain: none  Post-op Assessment: Post-op Vital signs reviewed, Patient's Cardiovascular Status Stable, Respiratory Function Stable, Patent Airway, No signs of Nausea or vomiting and Pain level controlled  Post-op Vital Signs: stable  Last Vitals:  Filed Vitals:   02/10/15 1240  BP: 115/65  Pulse: 89  Temp:   Resp: 14    Complications: No apparent anesthesia complications

## 2015-02-10 NOTE — OR Nursing (Signed)
First call to SICU at 1132.

## 2015-02-10 NOTE — Progress Notes (Signed)
  Echocardiogram Echocardiogram Transesophageal has been performed.  Michael Jefferson 02/10/2015, 8:33 AM

## 2015-02-10 NOTE — Progress Notes (Signed)
02/10/2015 12:53 AM After the patient's CHG bath for his CABG in the morning, the patient started having chest pains and his heart rhythm said he had gone into atrial fibrillation.  The patient was given one nitro tablet and 2 mg of IV morphine.  The patient had no history of A Fib. The patient also received his nightly 25 mg of Lopressor. An hour after his medicines were given, the patient's chest pain was resolved but the heart rhythm was still in A Fib. An EKG was done and confirmed the patient was in A Fib. The patient's heart rate would go from 140s to 110s back and forth. Dr. Berkley Harvey was informed of the patient's condition and ordered to start amiodarone IV protocol.  Patient has just received the amiodarone bolus. Will continue to monitor the patient. Lupita Dawn

## 2015-02-10 NOTE — Progress Notes (Signed)
02/10/2015 2:33 AM The patient is receiving his 250 mL bolus and has just converted back to sinus rhythm. Will continue to monitor the patient. Lupita Dawn

## 2015-02-11 ENCOUNTER — Encounter (HOSPITAL_COMMUNITY): Payer: Self-pay | Admitting: Surgery

## 2015-02-11 ENCOUNTER — Inpatient Hospital Stay (HOSPITAL_COMMUNITY): Payer: Commercial Managed Care - HMO

## 2015-02-11 LAB — CBC
HCT: 34.8 % — ABNORMAL LOW (ref 39.0–52.0)
HCT: 35.5 % — ABNORMAL LOW (ref 39.0–52.0)
Hemoglobin: 11.8 g/dL — ABNORMAL LOW (ref 13.0–17.0)
Hemoglobin: 12.2 g/dL — ABNORMAL LOW (ref 13.0–17.0)
MCH: 30.3 pg (ref 26.0–34.0)
MCH: 30.9 pg (ref 26.0–34.0)
MCHC: 33.9 g/dL (ref 30.0–36.0)
MCHC: 34.4 g/dL (ref 30.0–36.0)
MCV: 89.2 fL (ref 78.0–100.0)
MCV: 89.9 fL (ref 78.0–100.0)
PLATELETS: 154 10*3/uL (ref 150–400)
PLATELETS: 143 10*3/uL — AB (ref 150–400)
RBC: 3.9 MIL/uL — AB (ref 4.22–5.81)
RBC: 3.95 MIL/uL — AB (ref 4.22–5.81)
RDW: 13.2 % (ref 11.5–15.5)
RDW: 13.2 % (ref 11.5–15.5)
WBC: 18.9 10*3/uL — ABNORMAL HIGH (ref 4.0–10.5)
WBC: 19.6 10*3/uL — ABNORMAL HIGH (ref 4.0–10.5)

## 2015-02-11 LAB — MAGNESIUM
MAGNESIUM: 2.1 mg/dL (ref 1.5–2.5)
Magnesium: 2.2 mg/dL (ref 1.5–2.5)

## 2015-02-11 LAB — BASIC METABOLIC PANEL
Anion gap: 5 (ref 5–15)
BUN: 11 mg/dL (ref 6–23)
CHLORIDE: 106 mmol/L (ref 96–112)
CO2: 27 mmol/L (ref 19–32)
CREATININE: 0.71 mg/dL (ref 0.50–1.35)
Calcium: 8.1 mg/dL — ABNORMAL LOW (ref 8.4–10.5)
GFR calc Af Amer: >90 mL/min (ref >90)
GFR calc non Af Amer: >90 mL/min (ref >90)
GLUCOSE: 116 mg/dL — AB (ref 70–99)
Potassium: 4 mmol/L (ref 3.5–5.1)
Sodium: 138 mmol/L (ref 135–145)

## 2015-02-11 LAB — GLUCOSE, CAPILLARY
GLUCOSE-CAPILLARY: 117 mg/dL — AB (ref 70–99)
GLUCOSE-CAPILLARY: 126 mg/dL — AB (ref 70–99)
Glucose-Capillary: 118 mg/dL — ABNORMAL HIGH (ref 70–99)
Glucose-Capillary: 122 mg/dL — ABNORMAL HIGH (ref 70–99)
Glucose-Capillary: 132 mg/dL — ABNORMAL HIGH (ref 70–99)

## 2015-02-11 LAB — CREATININE, SERUM
CREATININE: 0.71 mg/dL (ref 0.50–1.35)
GFR calc Af Amer: >90 mL/min (ref >90)
GFR calc non Af Amer: >90 mL/min (ref >90)

## 2015-02-11 MED ORDER — INSULIN ASPART 100 UNIT/ML ~~LOC~~ SOLN: 0.0000 [IU] | SUBCUTANEOUS | Status: DC

## 2015-02-11 MED ORDER — AMIODARONE HCL 200 MG PO TABS
400.0000 mg | ORAL_TABLET | Freq: Two times a day (BID) | ORAL | Status: DC
  Administered 2015-02-11 – 2015-02-14 (×7): 400 mg via ORAL
  Filled 2015-02-11 (×8): qty 2

## 2015-02-11 MED ORDER — ACETAMINOPHEN 325 MG PO TABS: 650.0000 mg | ORAL_TABLET | Freq: Four times a day (QID) | ORAL | Status: DC | PRN

## 2015-02-11 MED ORDER — TRAMADOL HCL 50 MG PO TABS
50.0000 mg | ORAL_TABLET | ORAL | Status: DC | PRN
  Administered 2015-02-12: 100 mg via ORAL
  Filled 2015-02-11: qty 2

## 2015-02-11 MED ORDER — SODIUM CHLORIDE 0.9 % IJ SOLN
3.0000 mL | Freq: Two times a day (BID) | INTRAMUSCULAR | Status: DC
  Administered 2015-02-12 – 2015-02-13 (×2): 3 mL via INTRAVENOUS

## 2015-02-11 MED ORDER — METOPROLOL TARTRATE 12.5 MG HALF TABLET
12.5000 mg | ORAL_TABLET | Freq: Two times a day (BID) | ORAL | Status: DC
  Filled 2015-02-11 (×3): qty 1

## 2015-02-11 MED ORDER — ENOXAPARIN SODIUM 40 MG/0.4ML ~~LOC~~ SOLN
40.0000 mg | Freq: Every day | SUBCUTANEOUS | Status: DC
  Administered 2015-02-11 – 2015-02-13 (×3): 40 mg via SUBCUTANEOUS
  Filled 2015-02-11 (×4): qty 0.4

## 2015-02-11 MED ORDER — INSULIN ASPART 100 UNIT/ML ~~LOC~~ SOLN
0.0000 [IU] | SUBCUTANEOUS | Status: DC
  Administered 2015-02-11 – 2015-02-12 (×2): 2 [IU] via SUBCUTANEOUS

## 2015-02-11 MED ORDER — FUROSEMIDE 10 MG/ML IJ SOLN
40.0000 mg | Freq: Once | INTRAMUSCULAR | Status: AC
  Administered 2015-02-11: 40 mg via INTRAVENOUS

## 2015-02-11 MED ORDER — SODIUM CHLORIDE 0.9 % IV SOLN: 250.0000 mL | INTRAVENOUS | Status: DC | PRN

## 2015-02-11 MED ORDER — POTASSIUM CHLORIDE CRYS ER 20 MEQ PO TBCR
20.0000 meq | EXTENDED_RELEASE_TABLET | Freq: Two times a day (BID) | ORAL | Status: AC
  Administered 2015-02-12 – 2015-02-14 (×6): 20 meq via ORAL
  Filled 2015-02-11 (×6): qty 1

## 2015-02-11 MED ORDER — MOVING RIGHT ALONG BOOK
Freq: Once | Status: DC
  Filled 2015-02-11: qty 1

## 2015-02-11 MED ORDER — ONDANSETRON HCL 4 MG/2ML IJ SOLN
4.0000 mg | Freq: Four times a day (QID) | INTRAMUSCULAR | Status: DC | PRN
  Administered 2015-02-12: 4 mg via INTRAVENOUS
  Filled 2015-02-11: qty 2

## 2015-02-11 MED ORDER — OXYCODONE HCL 5 MG PO TABS
5.0000 mg | ORAL_TABLET | ORAL | Status: DC | PRN
  Administered 2015-02-12 – 2015-02-13 (×4): 10 mg via ORAL
  Filled 2015-02-11 (×4): qty 2

## 2015-02-11 MED ORDER — ASPIRIN EC 325 MG PO TBEC
325.0000 mg | DELAYED_RELEASE_TABLET | Freq: Every day | ORAL | Status: DC
  Administered 2015-02-12 – 2015-02-14 (×3): 325 mg via ORAL
  Filled 2015-02-11 (×3): qty 1

## 2015-02-11 MED ORDER — BISACODYL 5 MG PO TBEC
10.0000 mg | DELAYED_RELEASE_TABLET | Freq: Every day | ORAL | Status: DC | PRN
  Administered 2015-02-12: 10 mg via ORAL
  Filled 2015-02-11: qty 2

## 2015-02-11 MED ORDER — BISACODYL 10 MG RE SUPP: 10.0000 mg | Freq: Every day | RECTAL | Status: DC | PRN

## 2015-02-11 MED ORDER — FAMOTIDINE 20 MG PO TABS
20.0000 mg | ORAL_TABLET | Freq: Two times a day (BID) | ORAL | Status: DC
  Administered 2015-02-12 – 2015-02-14 (×6): 20 mg via ORAL
  Filled 2015-02-11 (×8): qty 1

## 2015-02-11 MED ORDER — DOCUSATE SODIUM 100 MG PO CAPS
200.0000 mg | ORAL_CAPSULE | Freq: Every day | ORAL | Status: DC
  Administered 2015-02-12: 200 mg via ORAL
  Filled 2015-02-11 (×2): qty 2

## 2015-02-11 MED ORDER — FUROSEMIDE 40 MG PO TABS
40.0000 mg | ORAL_TABLET | Freq: Every day | ORAL | Status: AC
  Administered 2015-02-12 – 2015-02-14 (×3): 40 mg via ORAL
  Filled 2015-02-11 (×3): qty 1

## 2015-02-11 MED ORDER — ONDANSETRON HCL 4 MG PO TABS: 4.0000 mg | ORAL_TABLET | Freq: Four times a day (QID) | ORAL | Status: DC | PRN

## 2015-02-11 MED ORDER — SODIUM CHLORIDE 0.9 % IJ SOLN: 3.0000 mL | INTRAMUSCULAR | Status: DC | PRN

## 2015-02-11 MED FILL — Magnesium Sulfate Inj 50%: INTRAMUSCULAR | Qty: 10 | Status: AC

## 2015-02-11 MED FILL — Potassium Chloride Inj 2 mEq/ML: INTRAVENOUS | Qty: 40 | Status: AC

## 2015-02-11 MED FILL — Heparin Sodium (Porcine) Inj 1000 Unit/ML: INTRAMUSCULAR | Qty: 30 | Status: AC

## 2015-02-11 MED FILL — Sodium Chloride IV Soln 0.9%: INTRAVENOUS | Qty: 50 | Status: AC

## 2015-02-11 NOTE — Progress Notes (Signed)
Patient ID: Michael Jefferson, male   DOB: [birth year], 74 y.o.   MRN: [id number]  SICU Evening Rounds:  Stable day. Awaiting bed on 2W.

## 2015-02-12 ENCOUNTER — Inpatient Hospital Stay (HOSPITAL_COMMUNITY): Payer: Commercial Managed Care - HMO

## 2015-02-12 LAB — GLUCOSE, CAPILLARY
Glucose-Capillary: 112 mg/dL — ABNORMAL HIGH (ref 70–99)
Glucose-Capillary: 134 mg/dL — ABNORMAL HIGH (ref 70–99)
Glucose-Capillary: 183 mg/dL — ABNORMAL HIGH (ref 70–99)

## 2015-02-12 LAB — BASIC METABOLIC PANEL
ANION GAP: 9 (ref 5–15)
BUN: 14 mg/dL (ref 6–23)
CO2: 26 mmol/L (ref 19–32)
Calcium: 8.5 mg/dL (ref 8.4–10.5)
Chloride: 99 mmol/L (ref 96–112)
Creatinine, Ser: 0.77 mg/dL (ref 0.50–1.35)
GFR calc Af Amer: >90 mL/min (ref >90)
GFR, EST NON AFRICAN AMERICAN: 89 mL/min — AB (ref >90)
Glucose, Bld: 102 mg/dL — ABNORMAL HIGH (ref 70–99)
Potassium: 4.2 mmol/L (ref 3.5–5.1)
SODIUM: 134 mmol/L — AB (ref 135–145)

## 2015-02-12 LAB — CBC
HEMATOCRIT: 36.9 % — AB (ref 39.0–52.0)
HEMOGLOBIN: 12.4 g/dL — AB (ref 13.0–17.0)
MCH: 30.6 pg (ref 26.0–34.0)
MCHC: 33.6 g/dL (ref 30.0–36.0)
MCV: 91.1 fL (ref 78.0–100.0)
Platelets: 143 10*3/uL — ABNORMAL LOW (ref 150–400)
RBC: 4.05 MIL/uL — ABNORMAL LOW (ref 4.22–5.81)
RDW: 13.5 % (ref 11.5–15.5)
WBC: 20.1 10*3/uL — ABNORMAL HIGH (ref 4.0–10.5)

## 2015-02-12 MED ORDER — METOPROLOL TARTRATE 25 MG PO TABS
25.0000 mg | ORAL_TABLET | Freq: Two times a day (BID) | ORAL | Status: DC
  Administered 2015-02-12 – 2015-02-14 (×5): 25 mg via ORAL
  Filled 2015-02-12 (×7): qty 1

## 2015-02-12 NOTE — Progress Notes (Addendum)
CARDIAC REHAB PHASE I   PRE:  Rate/Rhythm: 93 sinus  BP:  Supine: 118/69  Sitting:   Standing:    SaO2: 91-92 RA  MODE:  Ambulation: 550 ft  91%  POST:  Rate/Rhythem: 102  BP:  Supine: 130/68  Sitting:   Standing:    SaO2: 94 RA  Pt ambulated 550 ft with assist x1.  Pt tolerated walk well without complaint, but was ready for his pain meds upon return.  Pt returned back to bed after walk with VSS.  Pt encouraged to walk with staff and family over the weekend. Alberteen Sam, [place], ACSM RCEP [phone]  [phone] Reviewed discharge education with pt and wife.  Discussed diet, exercise, restrictions, ISP use, sternal precautions, and when to call MD/911. Also reviewed Cardiac Rehab Phase II with request to have info sent to [facility].  Pt and wife both voiced understanding. Alberteen Sam, MA, ACSM RCEP   Luan Pulling, Nita Sells

## 2015-02-12 NOTE — Discharge Summary (Signed)
Physician Discharge Summary       [address]       [place] [address]             [phone]    Patient ID: Michael Jefferson MRN: [id number] DOB/AGE: 1941/02/03 74 y.o.  Admit date: 02/08/2015 Discharge date: 02/14/2015   Admission Diagnoses: 1. S/p NSTEMI 2. Multivessel CAD 3. Hypertension 4. History of remote tobacco abuse   Discharge Diagnoses:  1. S/p NSTEMI 2. Multivessel CAD 3. Hypertension 4. History of remote tobacco abuse 5. Mild ABL anemia 6. Mild thrombocytopenia 7. Postop atrial fibrillation   Procedure (s):  1. Median Sternotomy 2. Extracorporeal circulation 3. Coronary artery bypass grafting x 3   Left internal mammary graft to the LAD  SVG to diagonal  SVG to Ramus  4. Endoscopic vein harvest from the right leg by Dr. Cyndia Bent on 02/10/2015.    History of Presenting Illness: This is a 74 yo male with previous history of kidney stones. He presented to the Emergency Department at [address] [facility] with complaints of chest pain. He stated this developed a day prior to admission and later subsided. However, the next day the patient was doing yard work at which time he became diaphoretic and felt like someone was sitting on his chest. This pain radiated down both arms. This prompted presentation to the ED. EKG was unremarkable. He was hypertensive and his initial troponin level was elevated at 0.8 and he was ruled in for NSTEMI and admitted for further care. Cardiology consult was obtained and the patient was evaluated by Dr. Nehemiah Massed who recommended Cardiac catheterization. This was done and showed multivessel CAD and it was Coronary Bypass Grafting would be indicated. TCTS was consulted and Dr. Cyndia Bent accepted the patient in transfer to [facility] for further care. Currently the patient is stable. He continues to have some intermittent chest tightness. He denies dyspnea, nausea, or vomiting. He denies recent smoking  history, but did use about 30 years ago. Dr. Cyndia Bent discussed the need for coronary artery bypass grafting surgery. Potential risks, benefits, and complications were discussed and the patient agreed to proceed with surgery. Preoperative carotid duplex US showed no significant carotid artery stenosis bilaterally. He underwent a CABG x 3 on 02/10/2015.   Brief Hospital Course:  The patient was extubated the afternoon of surgery without difficulty. He remained afebrile and hemodynamically stable. Gordy Councilman, a line, chest tubes, and foley were removed early in the post operative course. Lopressor was started and titrated accordingly. He was volume over loaded and diuresed. He did have a- fib and was started on Amiodarone. He remained in sinus rhythm. He had postop blood loss anemia. He did not require a post op transfusion. His last H and H was H 11.9 and 35.7. He was weaned off the insulin drip. The patient's glucose remained well controlled. The patient's HGA1C pre op was 5.6.  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 The patient was felt surgically stable for transfer from the ICU to [facility] for further convalescence on 02/11/2015. He continues to progress with cardiac rehab. He is ambulating on room air. He has been tolerating a diet and has had a bowel movement. Epicardial pacing wires will be removed prior to discharge. The patient is felt surgically stable for discharge today.   Latest Vital Signs: Blood pressure 110/65, pulse 91, temperature 98.9 F (37.2 C), temperature source Oral, resp. rate 18, height 5\' 5"  (1.651  m), weight 192 lb 7.4 oz (87.3 kg), SpO2 94 %.  Physical Exam: Cardiovascular: RRR, no murmur Pulmonary: Slightly diminished at bases bilaterally; no rales, wheezes, or rhonchi. Abdomen: Soft, non tender, bowel sounds present. Extremities: Mild bilateral lower extremity edema. Wounds: Clean and dry   Discharge Condition:Stable and  discharged to home  Recent laboratory studies:  Lab Results  Component Value Date   WBC 20.2* 02/13/2015   HGB 11.9* 02/13/2015   HCT 35.7* 02/13/2015   MCV 93.5 02/13/2015   PLT 172 02/13/2015   Lab Results  Component Value Date   NA 134* 02/12/2015   K 4.2 02/12/2015   CL 99 02/12/2015   CO2 26 02/12/2015   CREATININE 0.77 02/12/2015   GLUCOSE 102* 02/12/2015      Diagnostic Studies: Dg Chest 2 View  02/12/2015   CLINICAL DATA:  Subsequent evaluation status post CABG 2 days ago  EXAM: CHEST  2 VIEW  COMPARISON:  [id number]  FINDINGS: Stable mild cardiac enlargement. Vascular pattern normal. No pneumothorax. Trace right pleural effusion. Bilateral lower lobe atelectasis. No evidence of pulmonary edema.  IMPRESSION: Acceptable postoperative appearance with mild bibasilar atelectasis.   Electronically Signed   By: Skipper Cliche M.D.   On: 02/12/2015 09:03     Discharge Medications:   Medication List    TAKE these medications        amiodarone 400 MG tablet  Commonly known as:  PACERONE  Take 1 tablet (400 mg total) by mouth 2 (two) times daily. X 1 week, then decrease to 200 mg po bid     aspirin 325 MG EC tablet  Take 1 tablet (325 mg total) by mouth daily.     clobetasol cream 0.05 %  Commonly known as:  TEMOVATE  Apply 1 application topically 2 (two) times daily.     desonide 0.05 % cream  Commonly known as:  DESOWEN  Apply 1 application topically 2 (two) times daily.     furosemide 40 MG tablet  Commonly known as:  LASIX  Take 1 tablet (40 mg total) by mouth daily. X 1 week     metoprolol tartrate 25 MG tablet  Commonly known as:  LOPRESSOR  Take 1 tablet (25 mg total) by mouth 2 (two) times daily.     oxyCODONE 5 MG immediate release tablet  Commonly known as:  Oxy IR/ROXICODONE  Take 1-2 tablets (5-10 mg total) by mouth every 3 (three) hours as needed for severe pain.     potassium chloride SA 20 MEQ tablet  Commonly known as:  K-DUR,KLOR-CON  Take 1  tablet (20 mEq total) by mouth daily. X 1 week     simvastatin 40 MG tablet  Commonly known as:  ZOCOR  Take 1 tablet (40 mg total) by mouth daily.         The patient has been discharged on:   1.Beta Blocker:  Yes [  x ]                              No   [   ]                              If No, reason:  2.Ace Inhibitor/ARB: Yes [   ]  No  [  x ]                                     If No, reason: Low systolic BPs  3.Statin:   Yes [x   ]                  No  [   ]                  If No, reason:  4.Ecasa:  Yes  [ x  ]                  No   [   ]                  If No, reason:    Follow Up Appointments: Follow-up Information    Follow up with Corey Skains, MD.   Specialty:  Internal Medicine   Why:  Call for a follow up appointment for 2 weeks   Contact information:   [place] [address] [phone]       Follow up with Gaye Pollack, MD On 02/12/2015.   Specialty:  Cardiothoracic Surgery   Why:  PA/LAT CXR to be taken (at [facility] which is in the same building as Dr. Vivi Martens office) on hour prior to appointment with Dr. Teryl Lucy will mail appointment date and time   Contact information:   [address] [place] [address] [phone]       Follow up with Nurse On 02/18/2015.   Why:  Appointment is with nurse only to have chest tube sutures removed. Office will mail appointment time   Contact information:   [address] [place] [address] [phone]      Follow up with Juluis Pitch, MD.   Specialty:  Family Medicine   Why:  Call for a follow up appointment regarding further surveillance of HGA1C 5.6   Contact information:   [address] [place] [address] [phone]       Signed: Kenae Lindquist HPA-C 02/14/2015, 8:22 AM

## 2015-02-12 NOTE — Progress Notes (Signed)
Pt ambulated in hallway 350 ft with assist x 1 on room air. Pt tolerated activity well. Will continue to monitor.

## 2015-02-12 NOTE — Progress Notes (Addendum)
      [address]       [place] [address]             [phone]        2 Days Post-Op Procedure(s) (LRB): CORONARY ARTERY BYPASS GRAFTING (CABG), ON PUMP, TIMES THREE, USING LEFT INTERNAL MAMMARY ARTERY, RIGHT GREATER SAPHENOUS VEIN HARVESTED ENDOSCOPICALLY (N/A) TRANSESOPHAGEAL ECHOCARDIOGRAM (TEE) (N/A)  Subjective: Patient has some incisional pain. Passing a little flatus.  Objective: Vital signs in last 24 hours: Temp:  [97.6 F (36.4 C)-98.7 F (37.1 C)] 98.7 F (37.1 C) (03/19 0425) Pulse Rate:  [80-111] 93 (03/19 0425) Cardiac Rhythm:  [-] Normal sinus rhythm (03/18 2000) Resp:  [11-26] 19 (03/19 0425) BP: (96-148)/(59-77) 115/59 mmHg (03/19 0425) SpO2:  [93 %-98 %] 97 % (03/19 0425) Arterial Line BP: (99-127)/(57-66) 120/59 mmHg (03/18 1030) Weight:  [194 lb 14.2 oz (88.4 kg)] 194 lb 14.2 oz (88.4 kg) (03/19 0425)  Pre op weight 85.9 kg Current Weight  02/12/15 194 lb 14.2 oz (88.4 kg)    Hemodynamic parameters for last 24 hours: PAP: (22-29)/(15-20) 24/18 mmHg  Intake/Output from previous day: 03/18 0701 - 03/19 0700 In: 731.1 [P.O.:410; I.V.:321.1] Out: 1925 [Urine:1775; Chest Tube:150]   Physical Exam:  Cardiovascular: RRR, no murmur Pulmonary: Slightly diminished at bases bilaterally; no rales, wheezes, or rhonchi. Abdomen: Soft, non tender, bowel sounds present. Extremities: Mild bilateral lower extremity edema. Wounds: Dressing is clean and dry.   Lab Results: CBC: Recent Labs  02/11/15 2102 02/12/15 0333  WBC 19.6* 20.1*  HGB 12.2* 12.4*  HCT 35.5* 36.9*  PLT 143* 143*   BMET:  Recent Labs  02/11/15 0425 02/11/15 2102 02/12/15 0333  NA 138  --  134*  K 4.0  --  4.2  CL 106  --  99  CO2 27  --  26  GLUCOSE 116*  --  102*  BUN 11  --  14  CREATININE 0.71 0.71 0.77  CALCIUM 8.1*  --  8.5    PT/INR:  Lab Results  Component Value Date   INR 1.35 02/10/2015   INR 1.01 02/09/2015   ABG:  INR: Will add  last result for INR, ABG once components are confirmed Will add last 4 CBG results once components are confirmed  Assessment/Plan:  1. CV - S/p NSTEMI. Previous a fib. Maintaining SR in the 90's. On Lopressor 12.5 bid. HR at times in the 100's. Will increase Lopressor to 25 bid. Will try to start ACE in am if BP allows. 2.  Pulmonary - On 2 liters of oxygen via [place]. Wean as tolerates. CXR this am appears to show no pneumothorax, left base atelectasis. Encourage incentive spirometer. 3. Volume Overload - On Lasix 40 daily 4.  Acute blood loss anemia - H and H stable at 12.4 and 36.9 5. Mild thrombocytopenia-platelets stable at 143,000 6. CBGs 132/134/112. Pre op HGA1C 5.6. He is likely pre diabetic. Stop accu checks and SS PRN. 7. Remove EPW in am  ZIMMERMAN,DONIELLE MPA-C 02/12/2015,8:37 AM   Chart reviewed, patient examined, agree with above.

## 2015-02-12 NOTE — Discharge Instructions (Signed)
Activity: 1.May walk up steps °               2.No lifting more than ten pounds for four weeks.  °               3.No driving for four weeks. °               4.Stop any activity that causes chest pain, shortness of breath, dizziness, sweating or excessive weakness. °               5.Avoid straining. °               6.Continue with your breathing exercises daily. ° °Diet: Diabetic diet and Low fat, Low salt diet ° °Wound Care: May shower.  Clean wounds with mild soap and water daily. Contact the office at [phone] if any problems arise. ° °Coronary Artery Bypass Grafting, Care After °Refer to this sheet in the next few weeks. These instructions provide you with information on caring for yourself after your procedure. Your health care provider may also give you more specific instructions. Your treatment has been planned according to current medical practices, but problems sometimes occur. Call your health care provider if you have any problems or questions after your procedure. °WHAT TO EXPECT AFTER THE PROCEDURE °Recovery from surgery will be different for everyone. Some people feel well after 3 or 4 weeks, while for others it takes longer. After your procedure, it is typical to have the following: °· Nausea and a lack of appetite.   °· Constipation. °· Weakness and fatigue.   °· Depression or irritability.   °· Pain or discomfort at your incision site. °HOME CARE INSTRUCTIONS °· Take medicines only as directed by your health care provider. Do not stop taking medicines or start any new medicines without first checking with your health care provider. °· Take your pulse as directed by your health care provider. °· Perform deep breathing as directed by your health care provider. If you were given a device called an incentive spirometer, use it to practice deep breathing several times a day. Support your chest with a pillow or your arms when you take deep breaths or cough. °· Keep incision areas clean, dry, and  protected. Remove or change any bandages (dressings) only as directed by your health care provider. You may have skin adhesive strips over the incision areas. Do not take the strips off. They will fall off on their own. °· Check incision areas daily for any swelling, redness, or drainage. °· If incisions were made in your legs, do the following: °¨ Avoid crossing your legs.   °¨ Avoid sitting for long periods of time. Change positions every 30 minutes.   °¨ Elevate your legs when you are sitting. °· Wear compression stockings as directed by your health care provider. These stockings help keep blood clots from forming in your legs. °· Take showers once your health care provider approves. Until then, only take sponge baths. Pat incisions dry. Do not rub incisions with a washcloth or towel. Do not take baths, swim, or use a hot tub until your health care provider approves. °· Eat foods that are high in fiber, such as raw fruits and vegetables, whole grains, beans, and nuts. Meats should be lean cut. Avoid canned, processed, and fried foods. °· Drink enough fluid to keep your urine clear or pale yellow. °· Weigh yourself every day. This helps identify if you are retaining fluid that may make your heart and lungs   work harder. °· Rest and limit activity as directed by your health care provider. You may be instructed to: °¨ Stop any activity at once if you have chest pain, shortness of breath, irregular heartbeats, or dizziness. Get help right away if you have any of these symptoms. °¨ Move around frequently for short periods or take short walks as directed by your health care provider. Increase your activities gradually. You may need physical therapy or cardiac rehabilitation to help strengthen your muscles and build your endurance. °¨ Avoid lifting, pushing, or pulling anything heavier than 10 lb (4.5 kg) for at least 6 weeks after surgery. °· Do not drive until your health care provider approves.  °· Ask your health  care provider when you may return to work. °· Ask your health care provider when you may resume sexual activity. °· Keep all follow-up visits as directed by your health care provider. This is important. °SEEK MEDICAL CARE IF: °· You have swelling, redness, increasing pain, or drainage at the site of an incision. °· You have a fever. °· You have swelling in your ankles or legs. °· You have pain in your legs.   °· You gain 2 or more pounds (0.9 kg) a day. °· You are nauseous or vomit. °· You have diarrhea.  °SEEK IMMEDIATE MEDICAL CARE IF: °· You have chest pain that goes to your jaw or arms. °· You have shortness of breath.   °· You have a fast or irregular heartbeat.   °· You notice a "clicking" in your breastbone (sternum) when you move.   °· You have numbness or weakness in your arms or legs. °· You feel dizzy or light-headed.   °MAKE SURE YOU: °· Understand these instructions. °· Will watch your condition. °· Will get help right away if you are not doing well or get worse. °Document Released: 06/01/2005 Document Revised: 03/29/2014 Document Reviewed: 04/21/2013 °ExitCare® Patient Information ©2015 [employer]. This information is not intended to replace advice given to you by your health care provider. Make sure you discuss any questions you have with your health care provider. ° ° ° °

## 2015-02-13 LAB — CBC
HEMATOCRIT: 35.7 % — AB (ref 39.0–52.0)
Hemoglobin: 11.9 g/dL — ABNORMAL LOW (ref 13.0–17.0)
MCH: 31.2 pg (ref 26.0–34.0)
MCHC: 33.3 g/dL (ref 30.0–36.0)
MCV: 93.5 fL (ref 78.0–100.0)
PLATELETS: 172 10*3/uL (ref 150–400)
RBC: 3.82 MIL/uL — ABNORMAL LOW (ref 4.22–5.81)
RDW: 13.6 % (ref 11.5–15.5)
WBC: 20.2 10*3/uL — AB (ref 4.0–10.5)

## 2015-02-13 NOTE — Progress Notes (Addendum)
      [address]       ,[place] [address]             [phone]        3 Days Post-Op Procedure(s) (LRB): CORONARY ARTERY BYPASS GRAFTING (CABG), ON PUMP, TIMES THREE, USING LEFT INTERNAL MAMMARY ARTERY, RIGHT GREATER SAPHENOUS VEIN HARVESTED ENDOSCOPICALLY (N/A) TRANSESOPHAGEAL ECHOCARDIOGRAM (TEE) (N/A)  Subjective: Patient had loose stools after laxative. Otherwise, no complaints. He would like to go home.  Objective: Vital signs in last 24 hours: Temp:  [97.9 F (36.6 C)-98.1 F (36.7 C)] 98.1 F (36.7 C) (03/20 0629) Pulse Rate:  [79-96] 94 (03/20 0629) Cardiac Rhythm:  [-] Normal sinus rhythm (03/19 1932) Resp:  [18] 18 (03/20 0629) BP: (101-112)/(64-70) 101/66 mmHg (03/20 0629) SpO2:  [94 %-96 %] 94 % (03/20 0629) Weight:  [192 lb 3.9 oz (87.2 kg)] 192 lb 3.9 oz (87.2 kg) (03/20 0629)  Pre op weight 85.9 kg Current Weight  02/13/15 192 lb 3.9 oz (87.2 kg)      Intake/Output from previous day: 03/19 0701 - 03/20 0700 In: 480 [P.O.:480] Out: -    Physical Exam:  Cardiovascular: RRR, no murmur Pulmonary: Slightly diminished at bases bilaterally; no rales, wheezes, or rhonchi. Abdomen: Soft, non tender, bowel sounds present. Extremities: Mild bilateral lower extremity edema. Wounds: Clean and dry.   Lab Results: CBC:  Recent Labs  02/12/15 0333 02/13/15 0512  WBC 20.1* 20.2*  HGB 12.4* 11.9*  HCT 36.9* 35.7*  PLT 143* 172   BMET:   Recent Labs  02/11/15 0425 02/11/15 2102 02/12/15 0333  NA 138  --  134*  K 4.0  --  4.2  CL 106  --  99  CO2 27  --  26  GLUCOSE 116*  --  102*  BUN 11  --  14  CREATININE 0.71 0.71 0.77  CALCIUM 8.1*  --  8.5    PT/INR:  Lab Results  Component Value Date   INR 1.35 02/10/2015   INR 1.01 02/09/2015   ABG:  INR: Will add last result for INR, ABG once components are confirmed Will add last 4 CBG results once components are confirmed  Assessment/Plan:  1. CV - S/p NSTEMI.  Previous a fib. Maintaining SR in the 90's. On Lopressor 25 bid.  2.  Pulmonary - On room air. CXR this am appears to show no pneumothorax, left base atelectasis. Encourage incentive spirometer. 3. Volume Overload - On Lasix 40 daily 4. Acute blood loss anemia - H and H stable at 11.9 and 35.7 5. Thrombocytopenia resloved-platelets up to 172,000 6. Remove EPW  7. Stop stool softeners 8. Will likely discharge in am  ZIMMERMAN,DONIELLE MPA-C 02/13/2015,8:21 AM    Chart reviewed, patient examined, agree with above. He is doing well overall. Still has some excess volume. Continue lasix. Plan home tomorrow if no changes.

## 2015-02-13 NOTE — Progress Notes (Signed)
Patient ambulated independently in the hall for approximately 550 ft on room air. No sob, no stops, tolerated well, returned to room and recliner with call bell in reach, will continue to monitor.

## 2015-02-13 NOTE — Progress Notes (Signed)
Pt ambulated in hallway 650 ft independently on room air. Pt tolerated activity well. Will continue to monitor.

## 2015-02-13 NOTE — Progress Notes (Signed)
EPWs removed per MD order and protocol. Wire ends intact. Pt tolerated procedure well. Vital signs stable. Pt resting in bed X 1 hour. Bed alarm on. Call bell within reach. Will continue to monitor.

## 2015-02-14 MED ORDER — METOPROLOL TARTRATE 25 MG PO TABS: 25.0000 mg | ORAL_TABLET | Freq: Two times a day (BID) | ORAL | Status: DC

## 2015-02-14 MED ORDER — SIMVASTATIN 40 MG PO TABS: 40.0000 mg | ORAL_TABLET | Freq: Every day | ORAL | Status: DC

## 2015-02-14 MED ORDER — AMIODARONE HCL 400 MG PO TABS: 400.0000 mg | ORAL_TABLET | Freq: Two times a day (BID) | ORAL | Status: DC

## 2015-02-14 MED ORDER — FUROSEMIDE 40 MG PO TABS: 40.0000 mg | ORAL_TABLET | Freq: Every day | ORAL | Status: DC

## 2015-02-14 MED ORDER — ASPIRIN 325 MG PO TBEC: 325.0000 mg | DELAYED_RELEASE_TABLET | Freq: Every day | ORAL | Status: AC

## 2015-02-14 MED ORDER — OXYCODONE HCL 5 MG PO TABS: 5.0000 mg | ORAL_TABLET | ORAL | Status: DC | PRN

## 2015-02-14 MED ORDER — POTASSIUM CHLORIDE CRYS ER 20 MEQ PO TBCR: 20.0000 meq | EXTENDED_RELEASE_TABLET | Freq: Every day | ORAL | Status: DC

## 2015-02-14 NOTE — Progress Notes (Signed)
       [address]       [place] [address]             [phone]          4 Days Post-Op Procedure(s) (LRB): CORONARY ARTERY BYPASS GRAFTING (CABG), ON PUMP, TIMES THREE, USING LEFT INTERNAL MAMMARY ARTERY, RIGHT GREATER SAPHENOUS VEIN HARVESTED ENDOSCOPICALLY (N/A) TRANSESOPHAGEAL ECHOCARDIOGRAM (TEE) (N/A)  Subjective: Feels well, wants to go home.   Objective: Vital signs in last 24 hours: Patient Vitals for the past 24 hrs:  BP Temp Temp src Pulse Resp SpO2 Weight  02/14/15 0414 110/65 mmHg 98.9 F (37.2 C) Oral 91 18 94 % 192 lb 7.4 oz (87.3 kg)  02/13/15 1426 105/71 mmHg 97.7 F (36.5 C) Oral 96 18 95 % -  02/13/15 0933 127/78 mmHg - - 94 18 94 % -   Current Weight  02/14/15 192 lb 7.4 oz (87.3 kg)     Intake/Output from previous day: 03/20 0701 - 03/21 0700 In: 600 [P.O.:600] Out: -     PHYSICAL EXAM:  Heart: RRR Lungs: Clear Wound:Clean and dry Extremities:     Lab Results: CBC: Recent Labs  02/12/15 0333 02/13/15 0512  WBC 20.1* 20.2*  HGB 12.4* 11.9*  HCT 36.9* 35.7*  PLT 143* 172   BMET:  Recent Labs  02/11/15 2102 02/12/15 0333  NA  --  134*  K  --  4.2  CL  --  99  CO2  --  26  GLUCOSE  --  102*  BUN  --  14  CREATININE 0.71 0.77  CALCIUM  --  8.5    PT/INR: No results for input(s): LABPROT, INR in the last 72 hours.    Assessment/Plan: S/P Procedure(s) (LRB): CORONARY ARTERY BYPASS GRAFTING (CABG), ON PUMP, TIMES THREE, USING LEFT INTERNAL MAMMARY ARTERY, RIGHT GREATER SAPHENOUS VEIN HARVESTED ENDOSCOPICALLY (N/A) TRANSESOPHAGEAL ECHOCARDIOGRAM (TEE) (N/A)  CV- AF, now maintaining SR.  BPs stable.  Vol overload- continue diuresis.  Plan d/c home today, instructions reviewed with pt.    LOS: 6 days    COLLINS,GINA H 02/14/2015

## 2015-02-14 NOTE — Progress Notes (Signed)
Pt and wife given d/c instructions; both verbalized understanding; prescriptions given; pt to d/c home with wife; will cont. To monitor.

## 2015-02-14 NOTE — Progress Notes (Signed)
Pt up ambulating independently at this time; no needs; pt awaiting wife to arrive prior to d/c home; will cont. To monitor.

## 2015-02-14 NOTE — Progress Notes (Signed)
Reviewed ed, focusing on diet (lowering triglycerides) and ex gl. Voiced understanding. Set up d/c video. Pt plans to attend [place] [facility]. [phone] Yves Dill CES, ACSM 8:51 AM 02/14/2015

## 2015-02-17 DIAGNOSIS — Z951 Presence of aortocoronary bypass graft: Secondary | ICD-10-CM

## 2015-03-14 ENCOUNTER — Other Ambulatory Visit: Payer: Self-pay | Admitting: *Deleted

## 2015-03-14 NOTE — Patient Outreach (Signed)
[facility]) Care Management  03/14/2015  Lamondre Wesche. 1941/02/09 [id number]   [facility] referral for education and disease management, dx HTN.  Telephone call to patient advising of reason for call and of [facility] care management services. Questions were answered regarding [facility] care management services.  Patient voices that he has no major health care concerns at this time. Verifies that he does have [employer]. States most recent primary care appointment was March 2016; next appointment May 02, 2015. States he is seeing heart surgeon tomorrow and will be seeing cardiologist in 3-4 weeks. States she is accessing all of his doctors as needed and keeping scheduled appointments. States he will be attending outpatient cardiac rehabilitation when set up by doctor's office.   Patient states he has family support and his wife takes him to appointments.   Patient has declined [facility] care management services. Patient was given contact information for [facility] care management services if needed and also 24 hour Nurse Advice Line contact;   Will close out and send letter.  Sherrin Daisy, RN BSN [place] Management Coordinator [facility] Care Management  [phone]

## 2015-03-15 ENCOUNTER — Other Ambulatory Visit: Payer: Self-pay | Admitting: Surgery

## 2015-03-15 DIAGNOSIS — Z951 Presence of aortocoronary bypass graft: Secondary | ICD-10-CM

## 2015-03-16 ENCOUNTER — Ambulatory Visit: Payer: Commercial Managed Care - HMO | Admitting: Surgery

## 2015-03-16 ENCOUNTER — Ambulatory Visit
Admission: RE | Admit: 2015-03-16 | Discharge: 2015-03-16 | Disposition: A | Discharge status: Home / Self Care | Payer: Commercial Managed Care - HMO | Source: Ambulatory Visit | Attending: Surgery | Admitting: Surgery

## 2015-03-16 ENCOUNTER — Ambulatory Visit (INDEPENDENT_AMBULATORY_CARE_PROVIDER_SITE_OTHER): Payer: Self-pay | Admitting: Physician Assistant

## 2015-03-16 VITALS — BP 126/80 | HR 90 | Resp 20 | Ht 65.0 in | Wt 184.0 lb

## 2015-03-16 DIAGNOSIS — Z951 Presence of aortocoronary bypass graft: Secondary | ICD-10-CM

## 2015-03-16 NOTE — Progress Notes (Signed)
[address]       [place] [address]             [phone]          HPI: Patient returns for routine postoperative follow-up having undergone CABG x 3 by Dr. Cyndia Bent on 02/10/2015. The patient's postoperative course was notable for atrial fibrillation, which converted to sinus rhythm on Amiodarone. He overall did well and was discharged home on 02/14/2015 in good condition.  The patient saw Dr. Nehemiah Massed in the office following discharge and was doing well at that time.  However, shortly after his visit, the patient developed nausea and vomiting, which he felt was related to his medications.  He self discontinued all of his medications.  His wife spoke with Dr. Nehemiah Massed, and was told to resume Lopressor at 25 mg daily, but the patient refused.  He has not taken any of his medications over the past few weeks, but his nausea has resolved.  Otherwise, he has progressed well.  He denies chest discomfort, shortness of breath, palpitations or edema.  He is ambulating daily and his appetite is back to normal.  His daughter, who is a Marine scientist, has been checking his blood pressures, and his wife states that they have been running in the 536I systolic.    Current Outpatient Prescriptions  Medication Sig Dispense Refill  . amiodarone (PACERONE) 400 MG tablet Take 1 tablet (400 mg total) by mouth 2 (two) times daily. X 1 week, then decrease to 200 mg po bid (Patient not taking: Reported on 03/16/2015) 70 tablet 2  . aspirin EC 325 MG EC tablet Take 1 tablet (325 mg total) by mouth daily. (Patient not taking: Reported on 03/16/2015) 30 tablet 0  . clobetasol cream (TEMOVATE) 6.80 % Apply 1 application topically 2 (two) times daily.    Marland Kitchen desonide (DESOWEN) 0.05 % cream Apply 1 application topically 2 (two) times daily.    . metoprolol tartrate (LOPRESSOR) 25 MG tablet Take 1 tablet (25 mg total) by mouth 2 (two) times daily. (Patient not taking: Reported on 03/16/2015) 60 tablet 1  .  oxyCODONE (OXY IR/ROXICODONE) 5 MG immediate release tablet Take 1-2 tablets (5-10 mg total) by mouth every 3 (three) hours as needed for severe pain. (Patient not taking: Reported on 03/16/2015) 30 tablet 0  . simvastatin (ZOCOR) 40 MG tablet Take 1 tablet (40 mg total) by mouth daily. (Patient not taking: Reported on 03/16/2015) 30 tablet 1   No current facility-administered medications for this visit.     Physical Exam: BP 126/80 HR 90 Resp 20 Wounds: Clean and dry. Sternum stable. Heart: Regular rate and rhythm Lungs: Clear to auscultation Extremities: No edema   Diagnostic Tests: Chest xray: No results found.     Assessment/Plan: Mr. Minerd is doing well status post CABG.  He remains in sinus rhythm and blood pressures are controlled.  We had a long discussion about his medications.  I think that possibly the Amiodarone was causing the nausea, and since he is staying in sinus, he will not need to resume this.  However, I did encourage him to restart his Lopressor at the lower dose recommended by Dr. Nehemiah Massed, as well as an aspirin daily.  He refuses to take the Zocor, as he states that all statins make him sick, so we will hold off on that for now.  He does have an appointment with Dr. Nehemiah Massed in about 2 weeks.  From a surgical standpoint,  he looks great.  He may begin driving at this point and increasing his activity as tolerated.  He is interested in proceeding with cardiac rehab at [facility], and I have encouraged him to do so. We will see him back in 1 month for follow up.

## 2015-03-17 ENCOUNTER — Encounter: Payer: Self-pay | Admitting: *Deleted

## 2015-03-17 NOTE — Patient Outreach (Signed)
[place]) Care Management  03/17/2015  Jad Johansson. 27-Nov-1940 [id number]   Communications sent to MD and to patient.  Sherrin Daisy, RN BSN [place] Management Coordinator [facility] Care Management  [phone]

## 2015-03-27 NOTE — Discharge Summary (Signed)
PATIENT NAME:  Michael Jefferson, Michael Jefferson MR#:  [id number] DATE OF BIRTH:  1941-06-12  DATE OF ADMISSION:  02/05/2015  DATE OF DISCHARGE:  02/07/2015  The patient will be transferred to [facility] for further evaluation on his coronary artery disease and possible coronary artery bypass grafting when bed available.   PRESENTING COMPLAINT: Chest pain.   DISCHARGE DIAGNOSES:   1.  Acute non-Q-wave myocardial infarction with cardiac catheterization showing coronary artery disease, multivessel.  2.  Essential hypertension.  3.  Hyperlipidemia.   CONDITION ON DISCHARGE: Fair.  CODE STATUS: Full code.   CURRENT MEDICATIONS:   1.  IV normal saline at 100 an hour.  2.  Tylenol 650 p.o. q. 4 p.r.n.  3.  Aspirin 81 mg daily.  4.  Docusate 100 mg b.i.d. p.r.n.  5.  Metoprolol 25 mg b.i.d.   6.  Nitroglycerin ointment 0.5 inch b.i.d. topical.  7.  Zofran 4 mg IV q. 4 p.r.n.  8.  Ranitidine 150 mg b.i.d.  9.  Senokot 1 tablet p.o. b.i.d. p.r.n.  10.  Enoxaparin 85 mg subcutaneous b.i.d.  This was given on admission, currently on hold.  11.  Simvastatin 40 mg daily.  12.  Nitroglycerin 0.4 mg sublingual as needed.   IMAGING STUDIES:  Cardiac catheterization showed proximal LAD 75% stenosis, first diagonal there was 50% stenosis.  Ostia of ramus intermedius 50%.   RECOMMENDATION:  Possible coronary artery bypass grafting.  LABORATORY DATA:  Basic metabolic panel within normal limits. CBC within normal limits. Cholesterol was 244, triglycerides 421, HDL is 38.  Troponin was 0.07. 0.1 and 0.08.   CARDIOLOGY CONSULTATION:  Dr. Serafina Royals.   BRIEF SUMMARY OF HOSPITAL COURSE: Mr. Nawrot is a 74 year old Caucasian gentleman with no significant past medical history, came in to the Emergency Room with chest pain and elevated troponin. He was admitted with:  1.  Acute non-Q-wave MI likely cause for patient's chest pain. His symptoms were quite typical for angina, currently chest pain-free, hemodynamically  stable.  The patient was started on aspirin, Lovenox, beta blocker, statin, and nitroglycerin. He was seen by Dr. Nehemiah Massed.  Cardiac cath was done.  Results as above were noted.  Cardiology recommends a CABG for which patient is going to be transferred to [facility] when bed is available.  2.  Essential hypertension.  Continue metoprolol.   3.  Hyperlipidemia on simvastatin.   Discharge plans were discussed with patient and patient's wife.   TIME SPENT:  40 minutes. The patient remained a full code.   ____________________________ Hart Rochester. Posey Pronto, MD sap:sp D: 02/07/2015 11:37:00 ET T: 02/07/2015 11:49:19 ET JOB#: [id number]  cc: Youlanda Roys. Lovie Macadamia, MD Corey Skains, MD Zeev Deakins A. Posey Pronto, MD, <Dictator>   Ilda Basset MD ELECTRONICALLY SIGNED 02/28/2015 12:17

## 2015-03-27 NOTE — Consult Note (Signed)
PATIENT NAME:  Michael Jefferson, Michael Jefferson MR#:  [id number] DATE OF BIRTH:  [birth year]  DATE OF CONSULTATION:  02/06/2015  REFERRING PHYSICIAN:  Dr. Posey Pronto.  CONSULTING PHYSICIAN:  Corey Skains, MD  REASON FOR CONSULTATION: Non-ST elevation myocardial infarction.   CHIEF COMPLAINT: "I have chest pain."   HISTORY OF PRESENT ILLNESS: This is a 74 year old male with known borderline hypertension and hyperlipidemia who has not had any specific treatment. He has recently had some evidence of chest discomfort, substernal in nature, radiating into his back, associated with shortness of breath with physical activity, increasing in frequency and/or intensity over the last several weeks, concerning for cardiovascular disease. The patient has had this progressive and was seen in the Emergency Room with an EKG showing a normal sinus rhythm. His troponin did elevate to 0.1, consistent with non-ST elevation myocardial infarction. The patient, therefore, needs further treatment and has had some continued episodes of chest discomfort during his hospitalization. Currently, he is stable.   REVIEW OF SYSTEMS: The remainder of the review of systems is negative for vision change, ringing in the ears, hearing loss, cough, congestion, heartburn, nausea, vomiting, diarrhea, bloody stools, stomach pain, extremity pain, leg weakness, cramping of the buttocks, known blood clots, headaches, blackouts, dizzy spells, nosebleeds, congestion, trouble swallowing, frequent urination, urination at night, muscle weakness, numbness, anxiety, depression, skin lesions or skin rashes.   PAST MEDICAL HISTORY: Borderline hypertension and hyperlipidemia.   FAMILY HISTORY: No family members with early onset of cardiovascular disease but late onset of heart failure with his mother.   SOCIAL HISTORY: Currently denies alcohol or tobacco use.   ALLERGIES: As listed.   MEDICATIONS: As listed.   PHYSICAL EXAMINATION:  VITAL SIGNS: Blood pressure  is 110/68 bilaterally, heart rate 72 upright, reclining and regular.  GENERAL: He is a well appearing male, in no acute distress.  HEENT: No icterus, thyromegaly, ulcers, hemorrhage or xanthelasma.  CARDIOVASCULAR: Regular rate and rhythm. Normal S1, S2 without murmur, gallop or rub. PMI is normal size and placement. Carotid upstroke normal without bruit. Jugular venous pressure is normal.  LUNGS: Clear to auscultation with normal respirations.  ABDOMEN: Soft, nontender without hepatosplenomegaly or masses. Abdominal aorta is normal size without bruit.  EXTREMITIES: Show 2+ radial, femoral, dorsal pedal pulses with no lower extremity edema, cyanosis, clubbing or ulcers.  NEUROLOGIC: He is oriented to time, place and person with normal mood and affect.   ASSESSMENT: This is a 74 year old male with borderline hypertension and hyperlipidemia with symptoms and issues consistent with non-ST elevation myocardial infarction.   RECOMMENDATIONS:  1.  Nitrates topically.  2.  Beta blocker if necessary watching for bradycardia.  3.  Proceed to cardiac catheterization to assess coronary anatomy and further treatment thereof as necessary. The patient understands the risks and benefits of cardiac catheterization. These include the possibility of death, stroke, heart attack, infection, bleeding or blood clot. He is at low risk for conscious sedation.   ____________________________ Corey Skains, MD bjk:JT D: 02/06/2015 08:57:39 ET T: 02/06/2015 14:48:29 ET JOB#: [id number]  cc: Corey Skains, MD, <Dictator> Corey Skains MD ELECTRONICALLY SIGNED 02/10/2015 8:09

## 2015-03-27 NOTE — H&P (Signed)
PATIENT NAME:  Michael Jefferson, Michael Jefferson MR#:  [id number] DATE OF BIRTH:  [birth year]  DATE OF ADMISSION:  02/05/2015  PRIMARY CARE PHYSICIAN:  Dr. Lovie Macadamia.  CHIEF COMPLAINT:  Chest pain since yesterday.   HISTORY OF PRESENT ILLNESS:  The patient is a very pleasant 74 year old Caucasian gentleman with a history of kidney stones. Comes into the Emergency Room with complaints of chest pressure, chest heaviness since yesterday. He had one time chest pain yesterday, it eased off.  He came in today after he was doing some yard work, felt like somebody was "sitting on his chest."  He also complained of becoming diaphoretic and the pain radiating to his arm. He is currently rating his chest discomfort at 3/10. The patient also reports he has a lot of problems with indigestion, but this is very different from his issues with indigestion.  He usually takes over-the-counter Prilosec and Pepcid AC.   In the Emergency Room, the patient has some borderline hypertension. He reports his blood pressure stays on the lower side and is not on any medications.   His troponin was 0.08. EKG is normal sinus rhythm without any ST elevation or depression. He is being admitted for acute coronary syndrome, initiate episode of care.   PAST MEDICAL HISTORY:  A history of kidney stones.   FAMILY HISTORY: Mother has congestive heart failure.   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   MEDICATIONS: None.   ALLERGIES: No known drug allergies.   REVIEW OF SYSTEMS:  CONSTITUTIONAL: No fever, fatigue, or weakness.  EYES: No blurred or double vision, glaucoma, or cataracts.  EARS, NOSE, THROAT: No tinnitus, ear pain, hearing loss, or postnasal drip.  RESPIRATORY: No cough, wheeze, or hemoptysis.  CARDIOVASCULAR: Positive for chest pain. Positive for elevated blood pressure and there is no dyspnea on exertion.  GASTROINTESTINAL: No nausea, vomiting, diarrhea, abdominal pain,  or hematemesis. Positive for GERD. GENITOURINARY:  No dysuria or hematuria. ENDOCRINE:  No polyuria, nocturia, or thyroid problems.  HEMATOLOGY: No anemia or easy bruising.  SKIN: No acne or rash.  Warm and dry.  MUSCULOSKELETAL: No arthritis, swelling or gout.  NEUROLOGIC: No CVA, TIA, ataxia, or dementia.  PSYCHIATRIC: No anxiety or depression. All other systems reviewed and are negative.   PHYSICAL EXAMINATION:  GENERAL: The patient is awake, alert, and oriented x3, not in acute distress.  VITAL SIGNS: Afebrile. Pulse is 90, blood pressure is 149/99, and saturations are 95% on room air.  HEENT: Atraumatic, normocephalic. Pupils:  PERRLA.  EOM intact. Oral mucosa is moist.  NECK: Supple. No JVD. No carotid bruit.  RESPIRATORY: Clear to auscultation bilaterally. No rales or rhonchi, respiratory distress, or labored breathing.  CARDIOVASCULAR: Both the heart sounds are normal. Rate and rhythm is regular. PMI not lateralized. Chest is nontender.  EXTREMITIES: Good pedal pulses, good femoral pulses. No edema.  NEUROLOGY:  Grossly intact cranial nerves II through XII. No motor or sensory deficit.  PSYCHIATRIC: The patient is awake, alert, and oriented x3.  SKIN: Warm and dry.   EKG is normal sinus rhythm. Troponin is 0.08, CBC within normal limits.  Comprehensive metabolic, metabolic panel is within normal limits.   ASSESSMENT AND PLAN: A 74 year old Mr. Heffley who has no significant past medical history comes in with:  1.  Acute non-Q-wave myocardial infarction came in with chest heaviness with bilateral radiation to the arm along with diaphoresis. He is troponin of 0.08.  His risk factors  are borderline hypertension and smoking in the past. He will be admitted on the telemetry floor. We will start him on Nitroglycerin ointment, aspirin, low-dose beta blockers and Lovenox 1 mg/kg b.i.d.  The case was discussed with Dr. Nehemiah Massed who will see the patient in consultation.  Cycle cardiac enzymes  x2. Check lipid profile.  Further work-up per cardiology recommendation, will order echocardiogram of the heart.  2.  Elevated hypertension without diagnosis of blood pressure. Elevated blood pressure without diagnosis of hypertension.  We will start him on beta blockers, continue, adjust dosage as blood pressure allows.  3.  Deep vein thrombosis prophylaxis, already on Lovenox.  4.  Gastroesophageal reflux disease. We will place the patient on Zantac. Hospital stay otherwise.   Above was discussed with the patient.   TIME SPENT: 50 minutes.   Case was discussed with Dr. Nehemiah Massed.    CODE STATUS:  The patient is a full code.   TIME SPENT:  50 minutes.   ____________________________ Michael Rochester Posey Pronto, MD sap:at D: 02/05/2015 20:05:55 ET T: 02/05/2015 20:24:56 ET JOB#: [id number]  cc: Ronnetta Currington A. Posey Pronto, MD, <Dictator> Youlanda Roys. Lovie Macadamia, MD Ilda Basset MD ELECTRONICALLY SIGNED 02/28/2015 12:19

## 2015-03-31 ENCOUNTER — Encounter: Payer: Self-pay | Admitting: *Deleted

## 2015-04-10 ENCOUNTER — Other Ambulatory Visit: Payer: Self-pay | Admitting: Physician Assistant

## 2015-04-20 ENCOUNTER — Ambulatory Visit (INDEPENDENT_AMBULATORY_CARE_PROVIDER_SITE_OTHER): Payer: Self-pay | Admitting: Surgery

## 2015-04-20 ENCOUNTER — Encounter: Payer: Self-pay | Admitting: Surgery

## 2015-04-20 VITALS — BP 116/78 | HR 84 | Resp 16 | Ht 65.0 in | Wt 193.0 lb

## 2015-04-20 DIAGNOSIS — Z951 Presence of aortocoronary bypass graft: Secondary | ICD-10-CM

## 2015-04-20 NOTE — Progress Notes (Signed)
      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 The patient's early postoperative recovery while in the hospital was notable for development of postoperative atrial fibrillation converted with amiodarone. This has subsequently been stopped. Since hospital discharge the patient reports that he is feeling well and walking without chest pain or dyspnea. He is anxious to return to golfing.   Current Outpatient Prescriptions  Medication Sig Dispense Refill  . aspirin EC 325 MG EC tablet Take 1 tablet (325 mg total) by mouth daily. 30 tablet 0  . clobetasol cream (TEMOVATE) 1.61 % Apply 1 application topically 2 (two) times daily.    Marland Kitchen desonide (DESOWEN) 0.05 % cream Apply 1 application topically 2 (two) times daily.    . metoprolol tartrate (LOPRESSOR) 25 MG tablet Take 12.5 mg by mouth 2 (two) times daily.    . pravastatin (PRAVACHOL) 40 MG tablet Take 40 mg by mouth daily.     No current facility-administered medications for this visit.    Physical Exam: BP 116/78 mmHg  Pulse 84  Resp 16  Ht 5\' 5"  (1.651 m)  Wt 193 lb (87.544 kg)  BMI 32.12 kg/m2  SpO2 98% He looks well. Lung exam is clear. Cardiac exam shows a regular rate and rhythm with normal heart sounds. Chest incision is healing well and sternum is stable. The leg incisions are healing well and there is no peripheral edema.    Diagnostic Tests:   CLINICAL DATA: CABG.  EXAM: CHEST 2 VIEW  COMPARISON: None.  FINDINGS: Mediastinum and hilar structures normal. Prior median sternotomy and CABG. Lungs are clear of acute infiltrates. No pleural effusion or pneumothorax.  IMPRESSION: No acute cardiopulmonary disease. Prior CABG.   Electronically Signed  By: Marcello Moores Register  On: 03/16/2015 14:29     Impression:  Overall I think he is doing well. I encouraged him to continue walking. He has already returned to driving his car but should not  lift anything heavier than 10 lbs for three months postop. I advised him not to swing a golf club for 3 months postop but he can return to golf as of 05/13/2015.   Plan:  He will continue to follow up with Dr. Nehemiah Massed and Dr. Lovie Macadamia and will contact me if he develops any problems with his incisions.   Gaye Pollack, MD [facility] [phone]

## 2015-05-02 ENCOUNTER — Other Ambulatory Visit: Payer: Self-pay | Admitting: Physician Assistant

## 2016-07-05 IMAGING — CR DG CHEST 1V PORT
1 series · 1 of 1 positions shown · non-contrast
Comparison: Chest x-ray from yesterday

CLINICAL DATA: CABG

EXAM:
PORTABLE CHEST - 1 VIEW

[AP]
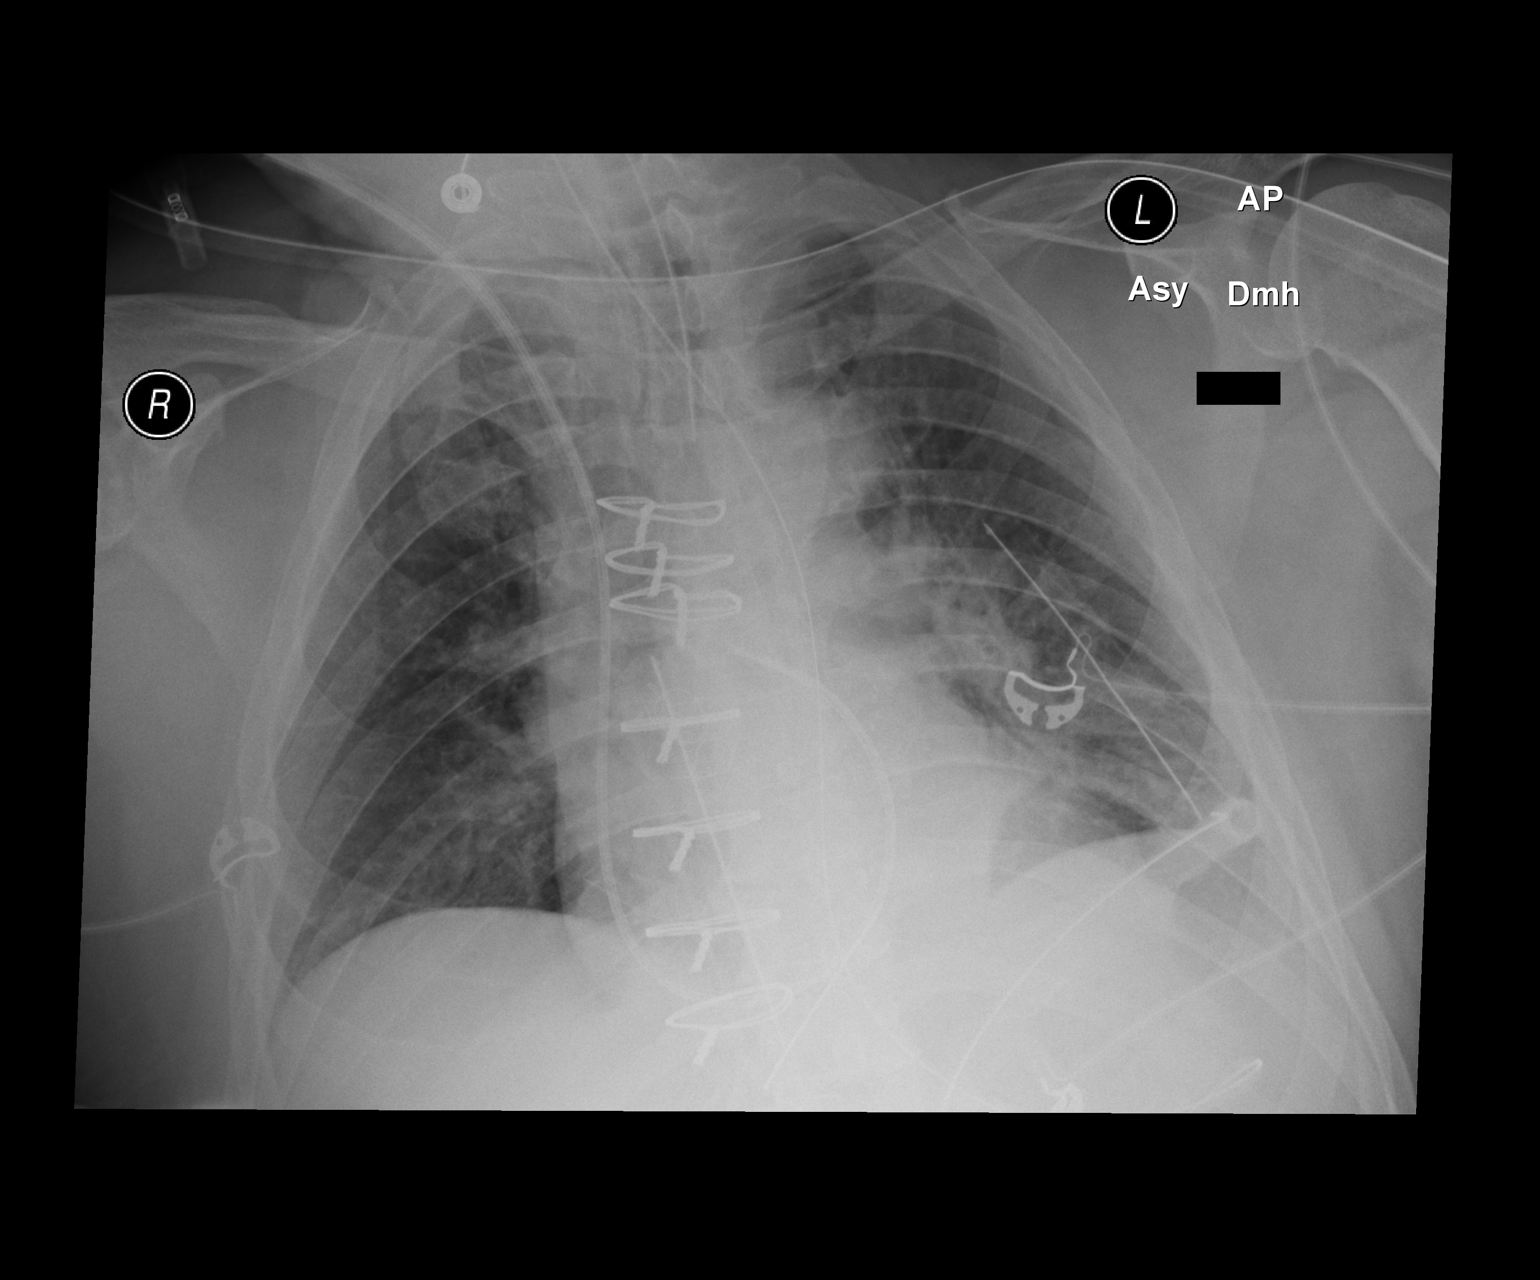

[1 of 1 positions shown; findings below may reference images not displayed]

FINDINGS: Endotracheal tube tip is at the clavicular heads. The orogastric
tube at least reaches the stomach.

There are thoracic drains without pneumothorax. Swan-Ganz catheter
from right IJ approach, tip directed towards the right main
pulmonary artery.

Stable heart size and aortic contours when accounting for low volume
study. There has been interval median sternotomy for CABG. Left
basilar and perihilar atelectasis. No edema or effusion.
IMPRESSION: 1. Unremarkable tubes and central line, as above.
2. Postoperative atelectasis on the left.

## 2016-09-19 ENCOUNTER — Encounter: Payer: Self-pay | Admitting: *Deleted

## 2016-09-25 ENCOUNTER — Encounter: Payer: Self-pay | Admitting: *Deleted

## 2016-09-25 ENCOUNTER — Ambulatory Visit: Payer: Commercial Managed Care - HMO | Admitting: Certified Registered Nurse Anesthetist

## 2016-09-25 ENCOUNTER — Encounter
Admission: RE | Disposition: A | Discharge status: Home / Self Care | Payer: Self-pay | Source: Ambulatory Visit | Attending: Ophthalmology

## 2016-09-25 ENCOUNTER — Ambulatory Visit
Admission: RE | Admit: 2016-09-25 | Discharge: 2016-09-25 | Disposition: A | Discharge status: Home / Self Care | Payer: Commercial Managed Care - HMO | Source: Ambulatory Visit | Attending: Ophthalmology | Admitting: Ophthalmology

## 2016-09-25 DIAGNOSIS — I2510 Atherosclerotic heart disease of native coronary artery without angina pectoris: Secondary | ICD-10-CM | POA: Insufficient documentation

## 2016-09-25 DIAGNOSIS — K219 Gastro-esophageal reflux disease without esophagitis: Secondary | ICD-10-CM | POA: Diagnosis not present

## 2016-09-25 DIAGNOSIS — E1136 Type 2 diabetes mellitus with diabetic cataract: Secondary | ICD-10-CM | POA: Diagnosis present

## 2016-09-25 DIAGNOSIS — I10 Essential (primary) hypertension: Secondary | ICD-10-CM | POA: Insufficient documentation

## 2016-09-25 DIAGNOSIS — N289 Disorder of kidney and ureter, unspecified: Secondary | ICD-10-CM | POA: Insufficient documentation

## 2016-09-25 DIAGNOSIS — I252 Old myocardial infarction: Secondary | ICD-10-CM | POA: Diagnosis not present

## 2016-09-25 DIAGNOSIS — Z87891 Personal history of nicotine dependence: Secondary | ICD-10-CM | POA: Diagnosis not present

## 2016-09-25 HISTORY — DX: Gastro-esophageal reflux disease without esophagitis: K21.9

## 2016-09-25 HISTORY — PX: CATARACT EXTRACTION W/PHACO: SHX586

## 2016-09-25 HISTORY — DX: Atherosclerotic heart disease of native coronary artery without angina pectoris: I25.10

## 2016-09-25 HISTORY — DX: Essential (primary) hypertension: I10

## 2016-09-25 HISTORY — DX: Acute myocardial infarction, unspecified: I21.9

## 2016-09-25 HISTORY — DX: Psoriasis, unspecified: L40.9

## 2016-09-25 SURGERY — PHACOEMULSIFICATION, CATARACT, WITH IOL INSERTION
Anesthesia: Monitor Anesthesia Care | Site: Eye | Laterality: Left | Wound class: Clean

## 2016-09-25 MED ORDER — EPINEPHRINE PF 1 MG/ML IJ SOLN
INTRAOCULAR | Status: DC | PRN
  Administered 2016-09-25: 1 mL via OPHTHALMIC

## 2016-09-25 MED ORDER — POVIDONE-IODINE 5 % OP SOLN
OPHTHALMIC | Status: AC
  Filled 2016-09-25: qty 30

## 2016-09-25 MED ORDER — NA CHONDROIT SULF-NA HYALURON 40-17 MG/ML IO SOLN
INTRAOCULAR | Status: AC
  Filled 2016-09-25: qty 1

## 2016-09-25 MED ORDER — LIDOCAINE HCL (PF) 4 % IJ SOLN
INTRAMUSCULAR | Status: AC
  Filled 2016-09-25: qty 5

## 2016-09-25 MED ORDER — SODIUM CHLORIDE 0.9 % IV SOLN
INTRAVENOUS | Status: DC
Start: 2016-09-25 — End: 2016-09-25
  Administered 2016-09-25: 11:00:00 via INTRAVENOUS

## 2016-09-25 MED ORDER — EPINEPHRINE PF 1 MG/ML IJ SOLN
INTRAMUSCULAR | Status: AC
  Filled 2016-09-25: qty 2

## 2016-09-25 MED ORDER — MOXIFLOXACIN HCL 0.5 % OP SOLN
OPHTHALMIC | Status: AC
  Administered 2016-09-25: 1 [drp] via OPHTHALMIC
  Filled 2016-09-25: qty 3

## 2016-09-25 MED ORDER — MOXIFLOXACIN HCL 0.5 % OP SOLN
OPHTHALMIC | Status: DC | PRN
  Administered 2016-09-25: 1 [drp] via OPHTHALMIC

## 2016-09-25 MED ORDER — MIDAZOLAM HCL 2 MG/2ML IJ SOLN
INTRAMUSCULAR | Status: DC | PRN
  Administered 2016-09-25: 1 mg via INTRAVENOUS

## 2016-09-25 MED ORDER — ARMC OPHTHALMIC DILATING DROPS
OPHTHALMIC | Status: AC
  Administered 2016-09-25: 1 via OPHTHALMIC
  Filled 2016-09-25: qty 0.4

## 2016-09-25 MED ORDER — POVIDONE-IODINE 5 % OP SOLN
OPHTHALMIC | Status: DC | PRN
  Administered 2016-09-25: 1 via OPHTHALMIC

## 2016-09-25 MED ORDER — FENTANYL CITRATE (PF) 100 MCG/2ML IJ SOLN
INTRAMUSCULAR | Status: DC | PRN
Start: 2016-09-25 — End: 2016-09-25
  Administered 2016-09-25: 50 ug via INTRAVENOUS

## 2016-09-25 MED ORDER — CARBACHOL 0.01 % IO SOLN
INTRAOCULAR | Status: DC | PRN
  Administered 2016-09-25: 0.5 mL via INTRAOCULAR

## 2016-09-25 MED ORDER — NA CHONDROIT SULF-NA HYALURON 40-17 MG/ML IO SOLN
INTRAOCULAR | Status: DC | PRN
  Administered 2016-09-25: 1 mL via INTRAOCULAR

## 2016-09-25 MED ORDER — MOXIFLOXACIN HCL 0.5 % OP SOLN
1.0000 [drp] | OPHTHALMIC | Status: AC
  Administered 2016-09-25 (×3): 1 [drp] via OPHTHALMIC

## 2016-09-25 MED ORDER — ARMC OPHTHALMIC DILATING DROPS
1.0000 "application " | OPHTHALMIC | Status: AC
  Administered 2016-09-25 (×3): 1 via OPHTHALMIC

## 2016-09-25 MED ORDER — LIDOCAINE HCL (PF) 4 % IJ SOLN
INTRAOCULAR | Status: DC | PRN
  Administered 2016-09-25: 4 mL via OPHTHALMIC

## 2016-09-25 SURGICAL SUPPLY — 21 items
CANNULA ANT/CHMB 27GA (MISCELLANEOUS) ×2 IMPLANT
CUP MEDICINE 2OZ PLAST GRAD ST (MISCELLANEOUS) ×2 IMPLANT
GLOVE BIO SURGEON STRL SZ8 (GLOVE) ×2 IMPLANT
GLOVE BIOGEL M 6.5 STRL (GLOVE) ×2 IMPLANT
GLOVE SURG LX 8.0 MICRO (GLOVE) ×1
GLOVE SURG LX STRL 8.0 MICRO (GLOVE) ×1 IMPLANT
GOWN STRL REUS W/ TWL LRG LVL3 (GOWN DISPOSABLE) ×2 IMPLANT
GOWN STRL REUS W/TWL LRG LVL3 (GOWN DISPOSABLE) ×2
LENS IOL TECNIS ITEC 19.5 (Intraocular Lens) ×2 IMPLANT
PACK CATARACT (MISCELLANEOUS) ×2 IMPLANT
PACK CATARACT BRASINGTON LX (MISCELLANEOUS) ×2 IMPLANT
PACK EYE AFTER SURG (MISCELLANEOUS) ×2 IMPLANT
SOL BSS BAG (MISCELLANEOUS) ×2
SOL PREP PVP 2OZ (MISCELLANEOUS) ×2
SOLUTION BSS BAG (MISCELLANEOUS) ×1 IMPLANT
SOLUTION PREP PVP 2OZ (MISCELLANEOUS) ×1 IMPLANT
SYR 3ML LL SCALE MARK (SYRINGE) ×2 IMPLANT
SYR 5ML LL (SYRINGE) ×2 IMPLANT
SYR TB 1ML 27GX1/2 LL (SYRINGE) ×2 IMPLANT
WATER STERILE IRR 250ML POUR (IV SOLUTION) ×2 IMPLANT
WIPE NON LINTING 3.25X3.25 (MISCELLANEOUS) ×2 IMPLANT

## 2016-09-25 NOTE — H&P (Signed)
All labs reviewed. Abnormal studies sent to patients PCP when indicated.  Previous H&P reviewed, patient examined, there are NO CHANGES.  Michael Coppess LOUIS10/31/201711:39 AM

## 2016-09-25 NOTE — Transfer of Care (Signed)
Immediate Anesthesia Transfer of Care Note  Patient: Michael Jefferson.  Procedure(s) Performed: Procedure(s) with comments: CATARACT EXTRACTION PHACO AND INTRAOCULAR LENS PLACEMENT (IOC) (Left) - [place] 37.5 AP% 20.1 CDE 7.56 Fluid Pack lot # [id number]  Patient Location: PACU  Anesthesia Type:MAC  Level of Consciousness: awake, alert  and oriented  Airway & Oxygen Therapy: Patient Spontanous Breathing and Patient connected to nasal cannula oxygen  Post-op Assessment: Report given to RN and Post -op Vital signs reviewed and stable  Post vital signs: Reviewed and stable  Last Vitals:  Vitals:   09/25/16 1032  BP: 136/78  Pulse: 90  Resp: 16  Temp: 36.3 C    Last Pain:  Vitals:   09/25/16 1032  TempSrc: Tympanic         Complications: No apparent anesthesia complications

## 2016-09-25 NOTE — Discharge Instructions (Signed)
Eye Surgery Discharge Instructions  Expect mild scratchy sensation or mild soreness. DO NOT RUB YOUR EYE!  The day of surgery:  Minimal physical activity, but bed rest is not required  No reading, computer work, or close hand work  No bending, lifting, or straining.  May watch TV  For 24 hours:  No driving, legal decisions, or alcoholic beverages  Safety precautions  Eat anything you prefer: It is better to start with liquids, then soup then solid foods.  _____ Eye patch should be worn until postoperative exam tomorrow.  ____ Solar shield eyeglasses should be worn for comfort in the sunlight/patch while sleeping  Resume all regular medications including aspirin or Coumadin if these were discontinued prior to surgery. You may shower, bathe, shave, or wash your hair. Tylenol may be taken for mild discomfort.  Call your doctor if you experience significant pain, nausea, or vomiting, fever > 101 or other signs of infection. [phone] or [phone] Specific instructions:  Follow-up Information    Tim Lair, MD .   Specialty:  Ophthalmology Why:  November 1 at 8:30am Contact information: [address] [place] [address] [phone]

## 2016-09-25 NOTE — Anesthesia Postprocedure Evaluation (Signed)
Anesthesia Post Note  Patient: Michael Jefferson.  Procedure(s) Performed: Procedure(s) (LRB): CATARACT EXTRACTION PHACO AND INTRAOCULAR LENS PLACEMENT (IOC) (Left)  Patient location during evaluation: PACU Anesthesia Type: MAC Level of consciousness: awake and alert and oriented Pain management: satisfactory to patient Vital Signs Assessment: post-procedure vital signs reviewed and stable Respiratory status: respiratory function stable Cardiovascular status: stable Anesthetic complications: no    Last Vitals:  Vitals:   09/25/16 1032  BP: 136/78  Pulse: 90  Resp: 16  Temp: 36.3 C    Last Pain:  Vitals:   09/25/16 1032  TempSrc: Tympanic                 Blima Singer

## 2016-09-25 NOTE — Anesthesia Preprocedure Evaluation (Signed)
Anesthesia Evaluation  Patient identified by MRN, date of birth, ID band Patient awake    Reviewed: Allergy & Precautions, H&P , NPO status , Patient's Chart, lab work & pertinent test results, reviewed documented beta blocker date and time   Airway Mallampati: II  TM Distance: >3 FB Neck ROM: full    Dental no notable dental hx. (+) Teeth Intact   Pulmonary neg pulmonary ROS, former smoker,    Pulmonary exam normal breath sounds clear to auscultation       Cardiovascular Exercise Tolerance: Good hypertension, + CAD and + Past MI  negative cardio ROS   Rhythm:regular Rate:Normal     Neuro/Psych negative neurological ROS  negative psych ROS   GI/Hepatic negative GI ROS, Neg liver ROS, GERD  ,  Endo/Other  negative endocrine ROSdiabetes  Renal/GU Renal disease     Musculoskeletal   Abdominal   Peds  Hematology negative hematology ROS (+)   Anesthesia Other Findings   Reproductive/Obstetrics negative OB ROS                             Anesthesia Physical Anesthesia Plan  ASA: III  Anesthesia Plan: MAC   Post-op Pain Management:    Induction:   Airway Management Planned:   Additional Equipment:   Intra-op Plan:   Post-operative Plan:   Informed Consent: I have reviewed the patients History and Physical, chart, labs and discussed the procedure including the risks, benefits and alternatives for the proposed anesthesia with the patient or authorized representative who has indicated his/her understanding and acceptance.     Plan Discussed with: CRNA  Anesthesia Plan Comments:         Anesthesia Quick Evaluation

## 2016-09-25 NOTE — Op Note (Signed)
PREOPERATIVE DIAGNOSIS:  Nuclear sclerotic cataract of the left eye.   POSTOPERATIVE DIAGNOSIS:  Nuclear sclerotic cataract of the left eye.   OPERATIVE PROCEDURE: Procedure(s): CATARACT EXTRACTION PHACO AND INTRAOCULAR LENS PLACEMENT (IOC)   SURGEON:  Birder Robson, MD.   ANESTHESIA:  Anesthesiologist: Molli Barrows, MD CRNA: Demetrius Charity, CRNA; Silvana Newness, CRNA  1.      Managed anesthesia care. 2.     0.33ml of Shugarcaine was instilled following the paracentesis   COMPLICATIONS:  None.   TECHNIQUE:   Stop and chop   DESCRIPTION OF PROCEDURE:  The patient was examined and consented in the preoperative holding area where the aforementioned topical anesthesia was applied to the left eye and then brought back to the Operating Room where the left eye was prepped and draped in the usual sterile ophthalmic fashion and a lid speculum was placed. A paracentesis was created with the side port blade and the anterior chamber was filled with viscoelastic. A near clear corneal incision was performed with the steel keratome. A continuous curvilinear capsulorrhexis was performed with a cystotome followed by the capsulorrhexis forceps. Hydrodissection and hydrodelineation were carried out with BSS on a blunt cannula. The lens was removed in a stop and chop  technique and the remaining cortical material was removed with the irrigation-aspiration handpiece. The capsular bag was inflated with viscoelastic and the Technis ZCB00 lens was placed in the capsular bag without complication. The remaining viscoelastic was removed from the eye with the irrigation-aspiration handpiece. The wounds were hydrated. The anterior chamber was flushed with Miostat and the eye was inflated to physiologic pressure. 0.55ml Vigamox was placed in the anterior chamber. The wounds were found to be water tight. The eye was dressed with Vigamox. The patient was given protective glasses to wear throughout the day and a shield with  which to sleep tonight. The patient was also given drops with which to begin a drop regimen today and will follow-up with me in one day.  Implant Name Type Inv. Item Serial No. Manufacturer Lot No. LRB No. Used  LENS IOL DIOP 19.5 - [id number] Intraocular Lens LENS IOL DIOP 19.5 [id number]   Left 1    Procedure(s) with comments: CATARACT EXTRACTION PHACO AND INTRAOCULAR LENS PLACEMENT (IOC) (Left) - [place] 37.5 AP% 20.1 CDE 7.56 Fluid Pack lot # [id number]  Electronically signed: [place] 09/25/2016 12:02 PM

## 2016-10-11 ENCOUNTER — Encounter: Payer: Self-pay | Admitting: *Deleted

## 2016-10-16 ENCOUNTER — Ambulatory Visit: Payer: Commercial Managed Care - HMO | Admitting: Certified Registered Nurse Anesthetist

## 2016-10-16 ENCOUNTER — Ambulatory Visit
Admission: RE | Admit: 2016-10-16 | Discharge: 2016-10-16 | Disposition: A | Discharge status: Home / Self Care | Payer: Commercial Managed Care - HMO | Source: Ambulatory Visit | Attending: Ophthalmology | Admitting: Ophthalmology

## 2016-10-16 ENCOUNTER — Encounter
Admission: RE | Disposition: A | Discharge status: Home / Self Care | Payer: Self-pay | Source: Ambulatory Visit | Attending: Ophthalmology

## 2016-10-16 ENCOUNTER — Encounter: Payer: Self-pay | Admitting: *Deleted

## 2016-10-16 DIAGNOSIS — Z888 Allergy status to other drugs, medicaments and biological substances status: Secondary | ICD-10-CM | POA: Diagnosis not present

## 2016-10-16 DIAGNOSIS — Z951 Presence of aortocoronary bypass graft: Secondary | ICD-10-CM | POA: Insufficient documentation

## 2016-10-16 DIAGNOSIS — Z79899 Other long term (current) drug therapy: Secondary | ICD-10-CM | POA: Diagnosis not present

## 2016-10-16 DIAGNOSIS — L409 Psoriasis, unspecified: Secondary | ICD-10-CM | POA: Insufficient documentation

## 2016-10-16 DIAGNOSIS — I10 Essential (primary) hypertension: Secondary | ICD-10-CM | POA: Diagnosis not present

## 2016-10-16 DIAGNOSIS — I2510 Atherosclerotic heart disease of native coronary artery without angina pectoris: Secondary | ICD-10-CM | POA: Diagnosis not present

## 2016-10-16 DIAGNOSIS — H2511 Age-related nuclear cataract, right eye: Secondary | ICD-10-CM | POA: Insufficient documentation

## 2016-10-16 DIAGNOSIS — Z87891 Personal history of nicotine dependence: Secondary | ICD-10-CM | POA: Diagnosis not present

## 2016-10-16 DIAGNOSIS — I252 Old myocardial infarction: Secondary | ICD-10-CM | POA: Diagnosis not present

## 2016-10-16 DIAGNOSIS — N289 Disorder of kidney and ureter, unspecified: Secondary | ICD-10-CM | POA: Diagnosis not present

## 2016-10-16 DIAGNOSIS — K219 Gastro-esophageal reflux disease without esophagitis: Secondary | ICD-10-CM | POA: Insufficient documentation

## 2016-10-16 DIAGNOSIS — E7800 Pure hypercholesterolemia, unspecified: Secondary | ICD-10-CM | POA: Diagnosis not present

## 2016-10-16 HISTORY — PX: CATARACT EXTRACTION W/PHACO: SHX586

## 2016-10-16 SURGERY — PHACOEMULSIFICATION, CATARACT, WITH IOL INSERTION
Anesthesia: Monitor Anesthesia Care | Site: Eye | Laterality: Right | Wound class: Clean

## 2016-10-16 MED ORDER — EPINEPHRINE PF 1 MG/ML IJ SOLN
INTRAMUSCULAR | Status: DC | PRN
  Administered 2016-10-16: 200 mL via OPHTHALMIC

## 2016-10-16 MED ORDER — ARMC OPHTHALMIC DILATING DROPS
OPHTHALMIC | Status: AC
  Administered 2016-10-16: 1 via OPHTHALMIC
  Filled 2016-10-16: qty 0.4

## 2016-10-16 MED ORDER — MOXIFLOXACIN HCL 0.5 % OP SOLN
OPHTHALMIC | Status: AC
  Administered 2016-10-16: 1 [drp] via OPHTHALMIC
  Filled 2016-10-16: qty 3

## 2016-10-16 MED ORDER — NA CHONDROIT SULF-NA HYALURON 40-17 MG/ML IO SOLN
INTRAOCULAR | Status: AC
  Filled 2016-10-16: qty 1

## 2016-10-16 MED ORDER — LIDOCAINE HCL (PF) 4 % IJ SOLN
INTRAMUSCULAR | Status: AC
  Filled 2016-10-16: qty 5

## 2016-10-16 MED ORDER — ARMC OPHTHALMIC DILATING DROPS: 1.0000 "application " | OPHTHALMIC | Status: AC

## 2016-10-16 MED ORDER — NA CHONDROIT SULF-NA HYALURON 40-17 MG/ML IO SOLN
INTRAOCULAR | Status: DC | PRN
  Administered 2016-10-16: 1 mL via INTRAOCULAR

## 2016-10-16 MED ORDER — FENTANYL CITRATE (PF) 100 MCG/2ML IJ SOLN: 25.0000 ug | INTRAMUSCULAR | Status: AC | PRN

## 2016-10-16 MED ORDER — CEFUROXIME OPHTHALMIC INJECTION 1 MG/0.1 ML: INJECTION | OPHTHALMIC | Status: DC | PRN

## 2016-10-16 MED ORDER — MOXIFLOXACIN HCL 0.5 % OP SOLN
1.0000 [drp] | OPHTHALMIC | Status: AC
  Administered 2016-10-16 (×3): 1 [drp] via OPHTHALMIC

## 2016-10-16 MED ORDER — FENTANYL CITRATE (PF) 100 MCG/2ML IJ SOLN
INTRAMUSCULAR | Status: DC | PRN
  Administered 2016-10-16: 50 ug via INTRAVENOUS

## 2016-10-16 MED ORDER — CARBACHOL 0.01 % IO SOLN
INTRAOCULAR | Status: DC | PRN
  Administered 2016-10-16: 0.5 mL via INTRAOCULAR

## 2016-10-16 MED ORDER — POVIDONE-IODINE 5 % OP SOLN
OPHTHALMIC | Status: AC
  Filled 2016-10-16: qty 30

## 2016-10-16 MED ORDER — ARMC OPHTHALMIC DILATING DROPS
1.0000 "application " | OPHTHALMIC | Status: AC
  Administered 2016-10-16 (×3): 1 via OPHTHALMIC

## 2016-10-16 MED ORDER — SODIUM CHLORIDE 0.9 % IV SOLN
INTRAVENOUS | Status: DC
  Administered 2016-10-16: 11:00:00 via INTRAVENOUS

## 2016-10-16 MED ORDER — MOXIFLOXACIN HCL 0.5 % OP SOLN: 1.0000 [drp] | OPHTHALMIC | Status: AC

## 2016-10-16 MED ORDER — LIDOCAINE HCL (PF) 4 % IJ SOLN
INTRAOCULAR | Status: DC | PRN
  Administered 2016-10-16: 200 mL via OPHTHALMIC

## 2016-10-16 MED ORDER — MIDAZOLAM HCL 2 MG/2ML IJ SOLN
INTRAMUSCULAR | Status: DC | PRN
  Administered 2016-10-16: 1 mg via INTRAVENOUS

## 2016-10-16 MED ORDER — ONDANSETRON HCL 4 MG/2ML IJ SOLN: 4.0000 mg | Freq: Once | INTRAMUSCULAR | Status: AC | PRN

## 2016-10-16 MED ORDER — EPINEPHRINE PF 1 MG/ML IJ SOLN
INTRAMUSCULAR | Status: AC
  Filled 2016-10-16: qty 2

## 2016-10-16 SURGICAL SUPPLY — 21 items
CANNULA ANT/CHMB 27GA (MISCELLANEOUS) ×2 IMPLANT
CUP MEDICINE 2OZ PLAST GRAD ST (MISCELLANEOUS) ×2 IMPLANT
GLOVE BIO SURGEON STRL SZ8 (GLOVE) ×2 IMPLANT
GLOVE BIOGEL M 6.5 STRL (GLOVE) ×2 IMPLANT
GLOVE SURG LX 8.0 MICRO (GLOVE) ×1
GLOVE SURG LX STRL 8.0 MICRO (GLOVE) ×1 IMPLANT
GOWN STRL REUS W/ TWL LRG LVL3 (GOWN DISPOSABLE) ×2 IMPLANT
GOWN STRL REUS W/TWL LRG LVL3 (GOWN DISPOSABLE) ×2
LENS IOL TECNIS ITEC 20.0 (Intraocular Lens) ×2 IMPLANT
PACK CATARACT (MISCELLANEOUS) ×2 IMPLANT
PACK CATARACT BRASINGTON LX (MISCELLANEOUS) ×2 IMPLANT
PACK EYE AFTER SURG (MISCELLANEOUS) ×2 IMPLANT
SOL BSS BAG (MISCELLANEOUS) ×2
SOL PREP PVP 2OZ (MISCELLANEOUS) ×2
SOLUTION BSS BAG (MISCELLANEOUS) ×1 IMPLANT
SOLUTION PREP PVP 2OZ (MISCELLANEOUS) ×1 IMPLANT
SYR 3ML LL SCALE MARK (SYRINGE) ×2 IMPLANT
SYR 5ML LL (SYRINGE) ×2 IMPLANT
SYR TB 1ML 27GX1/2 LL (SYRINGE) ×2 IMPLANT
WATER STERILE IRR 250ML POUR (IV SOLUTION) ×2 IMPLANT
WIPE NON LINTING 3.25X3.25 (MISCELLANEOUS) ×2 IMPLANT

## 2016-10-16 NOTE — Transfer of Care (Signed)
Immediate Anesthesia Transfer of Care Note  Patient: Piero Mcadory.  Procedure(s) Performed: Procedure(s) with comments: CATARACT EXTRACTION PHACO AND INTRAOCULAR LENS PLACEMENT (IOC) (Right) - Lot [id number] H [place]: 00:52.6 AP%: 19.4 CDE: 10.22  Patient Location: PACU  Anesthesia Type:MAC  Level of Consciousness: awake, alert  and oriented  Airway & Oxygen Therapy: Patient Spontanous Breathing  Post-op Assessment: Report given to RN and Post -op Vital signs reviewed and stable  Post vital signs: Reviewed and stable  Last Vitals:  Vitals:   10/16/16 1022  BP: 133/81  Pulse: 83  Resp: 18  Temp: 36.7 C    Last Pain:  Vitals:   10/16/16 1022  TempSrc: Oral  PainSc: 0-No pain         Complications: No apparent anesthesia complications

## 2016-10-16 NOTE — Anesthesia Procedure Notes (Signed)
Procedure Name: MAC Performed by: Matias Thurman Pre-anesthesia Checklist: Emergency Drugs available, Patient identified, Suction available, Patient being monitored and Timeout performed Oxygen Delivery Method: Nasal cannula       

## 2016-10-16 NOTE — Op Note (Signed)
PREOPERATIVE DIAGNOSIS:  Nuclear sclerotic cataract of the right eye.   POSTOPERATIVE DIAGNOSIS:  right nuclear sclerotic cataract   OPERATIVE PROCEDURE: Procedure(s): CATARACT EXTRACTION PHACO AND INTRAOCULAR LENS PLACEMENT (IOC)   SURGEON:  Birder Robson, MD.   ANESTHESIA:  Anesthesiologist: Alvin Critchley, MD CRNA: Demetrius Charity, CRNA  1.      Managed anesthesia care. 2.      0.71ml of Shugarcaine was instilled in the eye following the paracentesis.   COMPLICATIONS:  None.   TECHNIQUE:   Stop and chop   DESCRIPTION OF PROCEDURE:  The patient was examined and consented in the preoperative holding area where the aforementioned topical anesthesia was applied to the right eye and then brought back to the Operating Room where the right eye was prepped and draped in the usual sterile ophthalmic fashion and a lid speculum was placed. A paracentesis was created with the side port blade and the anterior chamber was filled with viscoelastic. A near clear corneal incision was performed with the steel keratome. A continuous curvilinear capsulorrhexis was performed with a cystotome followed by the capsulorrhexis forceps. Hydrodissection and hydrodelineation were carried out with BSS on a blunt cannula. The lens was removed in a stop and chop  technique and the remaining cortical material was removed with the irrigation-aspiration handpiece. The capsular bag was inflated with viscoelastic and the Technis ZCB00  lens was placed in the capsular bag without complication. The remaining viscoelastic was removed from the eye with the irrigation-aspiration handpiece. The wounds were hydrated. The anterior chamber was flushed with Miostat and the eye was inflated to physiologic pressure. 0.36ml of Vigamox was placed in the anterior chamber. The wounds were found to be water tight. The eye was dressed with Vigamox. The patient was given protective glasses to wear throughout the day and a shield with which to sleep  tonight. The patient was also given drops with which to begin a drop regimen today and will follow-up with me in one day.  Implant Name Type Inv. Item Serial No. Manufacturer Lot No. LRB No. Used  LENS IOL DIOP 20.0 - [id number] Intraocular Lens LENS IOL DIOP 20.0 [id number]   Right 1   Procedure(s) with comments: CATARACT EXTRACTION PHACO AND INTRAOCULAR LENS PLACEMENT (IOC) (Right) - Lot [id number] H [place]: 00:52.6 AP%: 19.4 CDE: 10.22  Electronically signed: [place] 10/16/2016 11:36 AM

## 2016-10-16 NOTE — Anesthesia Preprocedure Evaluation (Signed)
Anesthesia Evaluation  Patient identified by MRN, date of birth, ID band Patient awake    Reviewed: Allergy & Precautions, H&P , NPO status , Patient's Chart, lab work & pertinent test results, reviewed documented beta blocker date and time   Airway Mallampati: II  TM Distance: >3 FB Neck ROM: full    Dental no notable dental hx. (+) Teeth Intact   Pulmonary neg pulmonary ROS, former smoker,    Pulmonary exam normal breath sounds clear to auscultation       Cardiovascular Exercise Tolerance: Good hypertension, + CAD and + Past MI  negative cardio ROS   Rhythm:regular Rate:Normal     Neuro/Psych negative neurological ROS  negative psych ROS   GI/Hepatic negative GI ROS, Neg liver ROS, GERD  ,  Endo/Other  negative endocrine ROSdiabetes  Renal/GU Renal disease     Musculoskeletal   Abdominal   Peds  Hematology negative hematology ROS (+)   Anesthesia Other Findings   Reproductive/Obstetrics negative OB ROS                             Anesthesia Physical  Anesthesia Plan  ASA: III  Anesthesia Plan: MAC   Post-op Pain Management:    Induction:   Airway Management Planned:   Additional Equipment:   Intra-op Plan:   Post-operative Plan:   Informed Consent: I have reviewed the patients History and Physical, chart, labs and discussed the procedure including the risks, benefits and alternatives for the proposed anesthesia with the patient or authorized representative who has indicated his/her understanding and acceptance.     Plan Discussed with: CRNA  Anesthesia Plan Comments:         Anesthesia Quick Evaluation

## 2016-10-16 NOTE — Discharge Instructions (Signed)
Eye Surgery Discharge Instructions  Expect mild scratchy sensation or mild soreness. DO NOT RUB YOUR EYE!  The day of surgery:  Minimal physical activity, but bed rest is not required  No reading, computer work, or close hand work  No bending, lifting, or straining.  May watch TV  For 24 hours:  No driving, legal decisions, or alcoholic beverages  Safety precautions  Eat anything you prefer: It is better to start with liquids, then soup then solid foods.  _____ Eye patch should be worn until postoperative exam tomorrow.  ____ Solar shield eyeglasses should be worn for comfort in the sunlight/patch while sleeping  Resume all regular medications including aspirin or Coumadin if these were discontinued prior to surgery. You may shower, bathe, shave, or wash your hair. Tylenol may be taken for mild discomfort.  Call your doctor if you experience significant pain, nausea, or vomiting, fever > 101 or other signs of infection. [phone] or [phone] Specific instructions:  Follow-up Information    PORFILIO,WILLIAM LOUIS, MD Follow up on 10/17/2016.   Specialty:  Ophthalmology Why:  8:30 am Contact information: [address] [place] [address] [phone]

## 2016-10-16 NOTE — H&P (Signed)
All labs reviewed. Abnormal studies sent to patients PCP when indicated.  Previous H&P reviewed, patient examined, there are NO CHANGES.  [name] LOUIS11/21/201711:08 AM

## 2016-10-16 NOTE — Anesthesia Postprocedure Evaluation (Signed)
Anesthesia Post Note  Patient: Michael Jefferson.  Procedure(s) Performed: Procedure(s) (LRB): CATARACT EXTRACTION PHACO AND INTRAOCULAR LENS PLACEMENT (IOC) (Right)  Patient location during evaluation: PACU Anesthesia Type: MAC Level of consciousness: awake and alert and oriented Pain management: pain level controlled Vital Signs Assessment: post-procedure vital signs reviewed and stable Respiratory status: spontaneous breathing Cardiovascular status: blood pressure returned to baseline Anesthetic complications: no    Last Vitals:  Vitals:   10/16/16 1022  BP: 133/81  Pulse: 83  Resp: 18  Temp: 36.7 C    Last Pain:  Vitals:   10/16/16 1022  TempSrc: Oral  PainSc: 0-No pain                 Blima Singer

## 2018-11-04 ENCOUNTER — Ambulatory Visit
Admission: RE | Admit: 2018-11-04 | Discharge: 2018-11-04 | Disposition: A | Discharge status: Home / Self Care | Payer: Medicare HMO | Source: Ambulatory Visit | Attending: Otolaryngology | Admitting: Otolaryngology

## 2018-11-04 ENCOUNTER — Ambulatory Visit
Admission: RE | Admit: 2018-11-04 | Discharge: 2018-11-04 | Disposition: A | Discharge status: Home / Self Care | Payer: Medicare HMO | Attending: Otolaryngology | Admitting: Otolaryngology

## 2018-11-04 ENCOUNTER — Other Ambulatory Visit: Payer: Self-pay | Admitting: Otolaryngology

## 2018-11-04 DIAGNOSIS — R05 Cough: Secondary | ICD-10-CM | POA: Diagnosis present

## 2018-11-04 DIAGNOSIS — R059 Cough, unspecified: Secondary | ICD-10-CM

## 2019-11-23 ENCOUNTER — Ambulatory Visit: Payer: Medicare HMO | Admitting: Anesthesiology

## 2019-11-23 ENCOUNTER — Other Ambulatory Visit: Admission: RE | Admit: 2019-11-23 | Payer: Medicare HMO | Source: Ambulatory Visit | Admitting: Urology

## 2019-11-23 ENCOUNTER — Encounter: Payer: Self-pay | Admitting: Urology

## 2019-11-23 ENCOUNTER — Encounter
Admission: RE | Disposition: A | Discharge status: Home / Self Care | Payer: Self-pay | Source: Ambulatory Visit | Attending: Urology

## 2019-11-23 ENCOUNTER — Ambulatory Visit: Payer: Medicare HMO

## 2019-11-23 ENCOUNTER — Ambulatory Visit
Admission: RE | Admit: 2019-11-23 | Discharge: 2019-11-23 | Disposition: A | Discharge status: Home / Self Care | Payer: Medicare HMO | Source: Ambulatory Visit | Attending: Urology | Admitting: Urology

## 2019-11-23 DIAGNOSIS — N201 Calculus of ureter: Secondary | ICD-10-CM | POA: Diagnosis present

## 2019-11-23 DIAGNOSIS — Z87891 Personal history of nicotine dependence: Secondary | ICD-10-CM | POA: Diagnosis not present

## 2019-11-23 DIAGNOSIS — Z951 Presence of aortocoronary bypass graft: Secondary | ICD-10-CM | POA: Insufficient documentation

## 2019-11-23 DIAGNOSIS — I252 Old myocardial infarction: Secondary | ICD-10-CM | POA: Insufficient documentation

## 2019-11-23 DIAGNOSIS — K219 Gastro-esophageal reflux disease without esophagitis: Secondary | ICD-10-CM | POA: Insufficient documentation

## 2019-11-23 DIAGNOSIS — N132 Hydronephrosis with renal and ureteral calculous obstruction: Secondary | ICD-10-CM | POA: Diagnosis not present

## 2019-11-23 DIAGNOSIS — D494 Neoplasm of unspecified behavior of bladder: Secondary | ICD-10-CM | POA: Diagnosis not present

## 2019-11-23 DIAGNOSIS — N400 Enlarged prostate without lower urinary tract symptoms: Secondary | ICD-10-CM | POA: Insufficient documentation

## 2019-11-23 DIAGNOSIS — I10 Essential (primary) hypertension: Secondary | ICD-10-CM | POA: Insufficient documentation

## 2019-11-23 DIAGNOSIS — Z87442 Personal history of urinary calculi: Secondary | ICD-10-CM | POA: Diagnosis not present

## 2019-11-23 DIAGNOSIS — I2510 Atherosclerotic heart disease of native coronary artery without angina pectoris: Secondary | ICD-10-CM | POA: Diagnosis not present

## 2019-11-23 HISTORY — PX: CYSTOSCOPY W/ RETROGRADES: SHX1426

## 2019-11-23 HISTORY — PX: TRANSURETHRAL RESECTION OF BLADDER TUMOR: SHX2575

## 2019-11-23 HISTORY — PX: CYSTOSCOPY/URETEROSCOPY/HOLMIUM LASER/STENT PLACEMENT: SHX6546

## 2019-11-23 SURGERY — CYSTOSCOPY/URETEROSCOPY/HOLMIUM LASER/STENT PLACEMENT
Anesthesia: General | Site: Ureter | Laterality: Right

## 2019-11-23 MED ORDER — PHENYLEPHRINE HCL (PRESSORS) 10 MG/ML IV SOLN
INTRAVENOUS | Status: DC | PRN
  Administered 2019-11-23 (×2): 200 ug via INTRAVENOUS
  Administered 2019-11-23: 150 ug via INTRAVENOUS
  Administered 2019-11-23: 100 ug via INTRAVENOUS
  Administered 2019-11-23: 150 ug via INTRAVENOUS
  Administered 2019-11-23: 100 ug via INTRAVENOUS

## 2019-11-23 MED ORDER — DEXTROSE 5 % IV SOLN: 1000.0000 mg | Freq: Once | INTRAVENOUS | Status: DC

## 2019-11-23 MED ORDER — DEXAMETHASONE SODIUM PHOSPHATE 10 MG/ML IJ SOLN
INTRAMUSCULAR | Status: DC | PRN
  Administered 2019-11-23: 5 mg via INTRAVENOUS

## 2019-11-23 MED ORDER — LIDOCAINE HCL URETHRAL/MUCOSAL 2 % EX GEL
CUTANEOUS | Status: AC
  Filled 2019-11-23: qty 10

## 2019-11-23 MED ORDER — PROPOFOL 10 MG/ML IV BOLUS
INTRAVENOUS | Status: AC
  Filled 2019-11-23: qty 20

## 2019-11-23 MED ORDER — SODIUM CHLORIDE 0.9 % IV SOLN: INTRAVENOUS | Status: DC

## 2019-11-23 MED ORDER — DOCUSATE SODIUM 100 MG PO CAPS: 200.0000 mg | ORAL_CAPSULE | Freq: Two times a day (BID) | ORAL | 3 refills | Status: AC

## 2019-11-23 MED ORDER — ACETAMINOPHEN 10 MG/ML IV SOLN
INTRAVENOUS | Status: DC | PRN
  Administered 2019-11-23: 1000 mg via INTRAVENOUS

## 2019-11-23 MED ORDER — ROCURONIUM BROMIDE 100 MG/10ML IV SOLN
INTRAVENOUS | Status: DC | PRN
  Administered 2019-11-23: 10 mg via INTRAVENOUS
  Administered 2019-11-23: 30 mg via INTRAVENOUS
  Administered 2019-11-23: 10 mg via INTRAVENOUS

## 2019-11-23 MED ORDER — LIDOCAINE HCL (CARDIAC) PF 100 MG/5ML IV SOSY
PREFILLED_SYRINGE | INTRAVENOUS | Status: DC | PRN
  Administered 2019-11-23: 80 mg via INTRAVENOUS

## 2019-11-23 MED ORDER — FENTANYL CITRATE (PF) 100 MCG/2ML IJ SOLN
INTRAMUSCULAR | Status: DC | PRN
  Administered 2019-11-23 (×2): 50 ug via INTRAVENOUS

## 2019-11-23 MED ORDER — HYOSCYAMINE SULFATE SL 0.125 MG SL SUBL: 0.1250 mg | SUBLINGUAL_TABLET | SUBLINGUAL | 3 refills | Status: AC | PRN

## 2019-11-23 MED ORDER — BELLADONNA ALKALOIDS-OPIUM 16.2-60 MG RE SUPP
RECTAL | Status: AC
  Filled 2019-11-23: qty 1

## 2019-11-23 MED ORDER — ESMOLOL HCL 100 MG/10ML IV SOLN
INTRAVENOUS | Status: DC | PRN
  Administered 2019-11-23: 20 mg via INTRAVENOUS
  Administered 2019-11-23: 10 mg via INTRAVENOUS

## 2019-11-23 MED ORDER — METOPROLOL TARTRATE 25 MG PO TABS
ORAL_TABLET | ORAL | Status: AC
  Administered 2019-11-23: 16:00:00 12.5 mg via ORAL
  Filled 2019-11-23: qty 1

## 2019-11-23 MED ORDER — ONDANSETRON HCL 4 MG/2ML IJ SOLN
INTRAMUSCULAR | Status: DC | PRN
  Administered 2019-11-23: 4 mg via INTRAVENOUS

## 2019-11-23 MED ORDER — FENTANYL CITRATE (PF) 100 MCG/2ML IJ SOLN
INTRAMUSCULAR | Status: AC
  Filled 2019-11-23: qty 2

## 2019-11-23 MED ORDER — BELLADONNA ALKALOIDS-OPIUM 16.2-60 MG RE SUPP
RECTAL | Status: DC | PRN
  Administered 2019-11-23: 1 via RECTAL

## 2019-11-23 MED ORDER — CEFAZOLIN SODIUM-DEXTROSE 1-4 GM/50ML-% IV SOLN
INTRAVENOUS | Status: AC
  Filled 2019-11-23: qty 50

## 2019-11-23 MED ORDER — LIDOCAINE HCL URETHRAL/MUCOSAL 2 % EX GEL
CUTANEOUS | Status: DC | PRN
  Administered 2019-11-23: 1 via URETHRAL

## 2019-11-23 MED ORDER — EPHEDRINE SULFATE 50 MG/ML IJ SOLN
INTRAMUSCULAR | Status: DC | PRN
  Administered 2019-11-23 (×2): 7.5 mg via INTRAVENOUS

## 2019-11-23 MED ORDER — CEFAZOLIN SODIUM-DEXTROSE 1-4 GM/50ML-% IV SOLN
1.0000 g | Freq: Once | INTRAVENOUS | Status: AC
  Administered 2019-11-23: 1 g via INTRAVENOUS

## 2019-11-23 MED ORDER — KETOROLAC TROMETHAMINE 15 MG/ML IJ SOLN
INTRAMUSCULAR | Status: DC | PRN
  Administered 2019-11-23: 15 mg via INTRAVENOUS

## 2019-11-23 MED ORDER — URIBEL 118 MG PO CAPS: 1.0000 | ORAL_CAPSULE | Freq: Four times a day (QID) | ORAL | 3 refills | Status: AC | PRN

## 2019-11-23 MED ORDER — PROPOFOL 10 MG/ML IV BOLUS
INTRAVENOUS | Status: DC | PRN
  Administered 2019-11-23: 100 mg via INTRAVENOUS

## 2019-11-23 MED ORDER — TAMSULOSIN HCL 0.4 MG PO CAPS: 0.4000 mg | ORAL_CAPSULE | Freq: Every day | ORAL | 0 refills | Status: AC

## 2019-11-23 MED ORDER — IOHEXOL 180 MG/ML  SOLN
INTRAMUSCULAR | Status: DC | PRN
  Administered 2019-11-23: 40 mL

## 2019-11-23 MED ORDER — SUGAMMADEX SODIUM 200 MG/2ML IV SOLN
INTRAVENOUS | Status: DC | PRN
  Administered 2019-11-23: 200 mg via INTRAVENOUS

## 2019-11-23 MED ORDER — SUCCINYLCHOLINE CHLORIDE 20 MG/ML IJ SOLN
INTRAMUSCULAR | Status: AC
  Filled 2019-11-23: qty 1

## 2019-11-23 MED ORDER — ACETAMINOPHEN 10 MG/ML IV SOLN
INTRAVENOUS | Status: AC
  Filled 2019-11-23: qty 100

## 2019-11-23 MED ORDER — METOPROLOL TARTRATE 25 MG PO TABS: 12.5000 mg | ORAL_TABLET | Freq: Once | ORAL | Status: AC

## 2019-11-23 MED ORDER — CIPROFLOXACIN HCL 500 MG PO TABS: 500.0000 mg | ORAL_TABLET | Freq: Two times a day (BID) | ORAL | 1 refills | Status: AC

## 2019-11-23 SURGICAL SUPPLY — 26 items
BAG DRAIN CYSTO-URO LG1000N (MISCELLANEOUS) ×3 IMPLANT
BRUSH SCRUB EZ 1% IODOPHOR (MISCELLANEOUS) ×3 IMPLANT
CATH URETL 5X70 OPEN END (CATHETERS) ×3 IMPLANT
CNTNR SPEC 2.5X3XGRAD LEK (MISCELLANEOUS)
CONRAY 43 FOR UROLOGY 50M (MISCELLANEOUS) ×3 IMPLANT
CONT SPEC 4OZ STER OR WHT (MISCELLANEOUS)
CONTAINER SPEC 2.5X3XGRAD LEK (MISCELLANEOUS) IMPLANT
COVER WAND RF STERILE (DRAPES) ×3 IMPLANT
FIBER LASER FLEXIVA 365 (UROLOGICAL SUPPLIES) ×3 IMPLANT
GLOVE BIO SURGEON STRL SZ7.5 (GLOVE) ×3 IMPLANT
GOWN STRL REUS W/ TWL LRG LVL3 (GOWN DISPOSABLE) ×2 IMPLANT
GOWN STRL REUS W/ TWL LRG LVL4 (GOWN DISPOSABLE) ×2 IMPLANT
GOWN STRL REUS W/ TWL XL LVL3 (GOWN DISPOSABLE) ×2 IMPLANT
GOWN STRL REUS W/TWL LRG LVL3 (GOWN DISPOSABLE) ×1
GOWN STRL REUS W/TWL LRG LVL4 (GOWN DISPOSABLE) ×1
GOWN STRL REUS W/TWL XL LVL3 (GOWN DISPOSABLE) ×1
GUIDEWIRE STR ZIPWIRE 035X150 (MISCELLANEOUS) ×3 IMPLANT
KIT TURNOVER CYSTO (KITS) ×3 IMPLANT
PACK CYSTO AR (MISCELLANEOUS) ×3 IMPLANT
SET CYSTO W/LG BORE CLAMP LF (SET/KITS/TRAYS/PACK) ×3 IMPLANT
SOL .9 NS 3000ML IRR  AL (IV SOLUTION) ×1
SOL .9 NS 3000ML IRR UROMATIC (IV SOLUTION) ×2 IMPLANT
STENT URET 6FRX24 CONTOUR (STENTS) ×3 IMPLANT
SURGILUBE 2OZ TUBE FLIPTOP (MISCELLANEOUS) ×3 IMPLANT
SYR 10ML LL (SYRINGE) ×3 IMPLANT
WATER STERILE IRR 1000ML POUR (IV SOLUTION) ×3 IMPLANT

## 2019-11-23 NOTE — Anesthesia Postprocedure Evaluation (Signed)
Anesthesia Post Note  Patient: Michael Jefferson.  Procedure(s) Performed: CYSTOSCOPY/URETEROSCOPY/HOLMIUM LASER/STENT PLACEMENT (Right Ureter) CYSTOSCOPY WITH RETROGRADE PYELOGRAM (Right Ureter) TRANSURETHRAL RESECTION OF BLADDER TUMOR (TURBT) (N/A Bladder)  Patient location during evaluation: PACU Anesthesia Type: General Level of consciousness: awake and alert Pain management: pain level controlled Vital Signs Assessment: post-procedure vital signs reviewed and stable Respiratory status: spontaneous breathing, nonlabored ventilation, respiratory function stable and patient connected to nasal cannula oxygen Cardiovascular status: blood pressure returned to baseline and stable Postop Assessment: no apparent nausea or vomiting Anesthetic complications: no     Last Vitals:  Vitals:   11/23/19 1830 11/23/19 1840  BP: 126/77 126/77  Pulse:  (!) 107  Resp:  18  Temp: (!) 36 C (!) 36.1 C  SpO2:  94%    Last Pain:  Vitals:   11/23/19 1840  TempSrc: Temporal  PainSc: 0-No pain                 [name] S

## 2019-11-23 NOTE — Anesthesia Post-op Follow-up Note (Signed)
Anesthesia QCDR form completed.        

## 2019-11-23 NOTE — H&P (Signed)
Date of Initial H&P: 11/23/19  History reviewed, patient examined, no change in status, stable for surgery. 

## 2019-11-23 NOTE — Op Note (Signed)
Preoperative diagnosis: Right ureterolithiasis  Postoperative diagnosis:1.  Right ureterolithiasis                                             2.  Papillary bladder tumor  Procedure: 1.  Right ureteroscopic ureterolithotomy with holmium laser lithotripsy                      2.  Transurethral fulguration of bladder tumor                      3.  Right retrograde pyelography                      4.  Fluoroscopy                      5.  Right double-pigtail ureteral stent placement                        Surgeon: Otelia Limes. Yves Dill MD  Anesthesia: General  Indications:See the history and physical. After informed consent the above procedure(s) were requested     Technique and findings: After adequate general anesthesia had been obtained patient was placed into dorsal lithotomy position and the perineum was prepped and draped in the usual fashion.  Fluoroscopy did not reveal any obvious ureteral stones.  The 21 French cystoscope was then coupled to the camera and visually advanced into the bladder.  There was lateral lobe prostatic hypertrophy present.  There was a 3 mm papillary tumor lateral to the right ureteral orifice.  At this point an 8 [place] open-ended ureteral catheter was placed into the right ureteral orifice and retrograde pyelography performed.  Stone was identified in the distal ureter and measured approximately 3 mm in size.  A 0.035 Glidewire was then advanced up the right ureter under fluoroscopic guidance to the level of the right kidney.  The cystoscope was then removed and short mini semirigid ureteroscope coupled to the camera and then advanced into the right ureter.  The stone was visually identified and the 365 m holmium laser fiber introduced through the scope to the level of stone.  Power was set at 0.5 J with a frequency of 10 Hz.  Stone was then powdered.  The ureteroscope was then removed taking care to leave the guidewire in position.  The cystoscope was then visually  advanced back into the bladder the holmium laser was used to fulgurate the papillary bladder tumor.  The cystoscope was then removed and backloaded over the guidewire.  A 6 x 24 cm double-pigtail ureteral stent with retrieval suture was then advanced over the guidewire under fluoroscopic guidance.  The guidewire was then removed taking care leave the stent in position.  The bladder was then drained and the cystoscope was removed.  10 cc of viscous Xylocaine was instilled within the urethra and the bladder.  A B&O suppository was placed.  Procedure was then terminated and patient transferred to the recovery room in stable condition.

## 2019-11-23 NOTE — Anesthesia Procedure Notes (Signed)
Procedure Name: Intubation Date/Time: 11/23/2019 5:02 PM Performed by: Lowry Bowl, CRNA Pre-anesthesia Checklist: Patient identified, Emergency Drugs available, Suction available, Patient being monitored and Timeout performed Patient Re-evaluated:Patient Re-evaluated prior to induction Oxygen Delivery Method: Circle system utilized Preoxygenation: Pre-oxygenation with 100% oxygen Induction Type: IV induction Ventilation: Mask ventilation without difficulty Laryngoscope Size: Mac and 4 Grade View: Grade I Tube type: Oral Tube size: 7.5 mm Number of attempts: 1 Airway Equipment and Method: Stylet Placement Confirmation: ETT inserted through vocal cords under direct vision,  positive ETCO2 and breath sounds checked- equal and bilateral Secured at: 22 cm Tube secured with: Tape Dental Injury: Teeth and Oropharynx as per pre-operative assessment
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
## 2019-11-23 NOTE — OR Nursing (Signed)
HR is 120-133 bpm and regular. Dr. Marcello Moores from anethesia notified. Metoprolol given as ordered.

## 2019-11-23 NOTE — Anesthesia Preprocedure Evaluation (Signed)
Anesthesia Evaluation  Patient identified by MRN, date of birth, ID band Patient awake    Reviewed: Allergy & Precautions, NPO status , Patient's Chart, lab work & pertinent test results, reviewed documented beta blocker date and time   Airway Mallampati: III  TM Distance: >3 FB     Dental  (+) Chipped   Pulmonary former smoker,           Cardiovascular hypertension, Pt. on medications + CAD, + Past MI and + CABG       Neuro/Psych    GI/Hepatic GERD  Controlled,  Endo/Other    Renal/GU Renal disease     Musculoskeletal   Abdominal   Peds  Hematology   Anesthesia Other Findings   Reproductive/Obstetrics                             Anesthesia Physical Anesthesia Plan  ASA: III  Anesthesia Plan: General   Post-op Pain Management:    Induction: Intravenous  PONV Risk Score and Plan:   Airway Management Planned: Oral ETT and LMA  Additional Equipment:   Intra-op Plan:   Post-operative Plan:   Informed Consent: I have reviewed the patients History and Physical, chart, labs and discussed the procedure including the risks, benefits and alternatives for the proposed anesthesia with the patient or authorized representative who has indicated his/her understanding and acceptance.       Plan Discussed with: CRNA  Anesthesia Plan Comments:         Anesthesia Quick Evaluation

## 2019-11-23 NOTE — Discharge Instructions (Addendum)
Transurethral Resection of Bladder Tumor, Care After This sheet gives you information about how to care for yourself after your procedure. Your health care provider may also give you more specific instructions. If you have problems or questions, contact your health care provider. What can I expect after the procedure? After the procedure, it is common to have:  A small amount of blood in your urine for up to 2 weeks.  Soreness or mild pain from your catheter. After your catheter is removed, you may have mild soreness, especially when urinating.  Pain in your lower abdomen. Follow these instructions at home: Medicines   Take over-the-counter and prescription medicines only as told by your health care provider.  If you were prescribed an antibiotic medicine, take it as told by your health care provider. Do not stop taking the antibiotic even if you start to feel better.  Do not drive for 24 hours if you were given a sedative during your procedure.  Ask your health care provider if the medicine prescribed to you: ? Requires you to avoid driving or using heavy machinery. ? Can cause constipation. You may need to take these actions to prevent or treat constipation:  Take over-the-counter or prescription medicines.  Eat foods that are high in fiber, such as beans, whole grains, and fresh fruits and vegetables.  Limit foods that are high in fat and processed sugars, such as fried or sweet foods. Activity  Return to your normal activities as told by your health care provider. Ask your health care provider what activities are safe for you.  Do not lift anything that is heavier than 10 lb (4.5 kg), or the limit that you are told, until your health care provider says that it is safe.  Avoid intense physical activity for as long as told by your health care provider.  Rest as told by your health care provider.  Avoid sitting for a long time without moving. Get up to take short walks every  1-2 hours. This is important to improve blood flow and breathing. Ask for help if you feel weak or unsteady. General instructions   Do not drink alcohol for as long as told by your health care provider. This is especially important if you are taking prescription pain medicines.  Do not take baths, swim, or use a hot tub until your health care provider approves. Ask your health care provider if you may take showers. You may only be allowed to take sponge baths.  If you have a catheter, follow instructions from your health care provider about caring for your catheter and your drainage bag.  Drink enough fluid to keep your urine pale yellow.  Wear compression stockings as told by your health care provider. These stockings help to prevent blood clots and reduce swelling in your legs.  Keep all follow-up visits as told by your health care provider. This is important. ? You will need to be followed closely with regular checks of your bladder and urethra (cystoscopies) to make sure that the cancer does not come back. Contact a health care provider if:  You have pain that gets worse or does not improve with medicine.  You have blood in your urine for more than 2 weeks.  You have cloudy or bad-smelling urine.  You become constipated. Signs of constipation may include having: ? Fewer than three bowel movements in a week. ? Difficulty having a bowel movement. ? Stools that are dry, hard, or larger than normal.  You have  a fever. Get help right away if:  You have: ? Severe pain. ? Bright red blood in your urine. ? Blood clots in your urine. ? A lot of blood in your urine.  Your catheter has been removed and you are not able to urinate.  You have a catheter in place and the catheter is not draining urine. Summary  After your procedure, it is common to have a small amount of blood in your urine, soreness or mild pain from your catheter, and pain in your lower abdomen.  Take  over-the-counter and prescription medicines only as told by your health care provider.  Rest as told by your health care provider. Follow your health care provider's instructions about returning to normal activities. Ask what activities are safe for you.  If you have a catheter, follow instructions from your health care provider about caring for your catheter and your drainage bag.  Get help right away if you cannot urinate, you have severe pain, or you have bright red blood or blood clots in your urine. This information is not intended to replace advice given to you by your health care provider. Make sure you discuss any questions you have with your health care provider. Document Released: 10/24/2015 Document Revised: 06/12/2018 Document Reviewed: 06/12/2018 Elsevier Patient Education  2020 [place]. Kidney Stones  Kidney stones (urolithiasis) are solid, rock-like deposits that form inside of the organs that make urine (kidneys). A kidney stone may form in a kidney and move into the bladder, where it can cause intense pain and block the flow of urine. Kidney stones are created when high levels of certain minerals are found in the urine. They are usually passed through urination, but in some cases, medical treatment may be needed to remove them. What are the causes? Kidney stones may be caused by:  A condition in which certain glands produce too much parathyroid hormone (primary hyperparathyroidism), which causes too much calcium buildup in the blood.  Buildup of uric acid crystals in the bladder (hyperuricosuria). Uric acid is a chemical that the body produces when you eat certain foods. It usually exits the body in the urine.  Narrowing (stricture) of one or both of the tubes that drain urine from the kidneys to the bladder (ureters).  A kidney blockage that is present at birth (congenital obstruction).  Past surgery on the kidney or the ureters, such as gastric bypass surgery. What  increases the risk? The following factors make you more likely to develop kidney stones:  Having had a kidney stone in the past.  Having a family history of kidney stones.  Not drinking enough water.  Eating a diet that is high in protein, salt (sodium), or sugar.  Being overweight or obese. What are the signs or symptoms? Symptoms of a kidney stone may include:  Nausea.  Vomiting.  Blood in the urine (hematuria).  Pain in the side of the abdomen, right below the ribs (flank pain). Pain usually spreads (radiates) to the groin.  Needing to urinate frequently or urgently. How is this diagnosed? This condition may be diagnosed based on:  Your medical history.  A physical exam.  Blood tests.  Urine tests.  CT scan.  Abdominal X-ray.  A procedure to examine the inside of the bladder (cystoscopy). How is this treated? Treatment for kidney stones depends on the size, location, and makeup of the stones. Treatment may involve:  Analyzing your urine before and after you pass the stone through urination.  Being monitored  at the hospital until you pass the stone through urination.  Increasing your fluid intake and decreasing the amount of calcium and protein in your diet.  A procedure to break up kidney stones in the bladder using: ? A focused beam of light (laser therapy). ? Shock waves (extracorporeal shock wave lithotripsy).  Surgery to remove kidney stones. This may be needed if you have severe pain or have stones that block your urinary tract. Follow these instructions at home: Eating and drinking  Drink enough fluid to keep your urine clear or pale yellow. This will help you to pass the kidney stone.  If directed, change your diet. This may include: ? Limiting how much sodium you eat. ? Eating more fruits and vegetables. ? Limiting how much meat, poultry, fish, and eggs you eat.  Follow instructions from your health care provider about eating or drinking  restrictions. General instructions  Collect urine samples as told by your health care provider. You may need to collect a urine sample: ? 24 hours after you pass the stone. ? 8-12 weeks after passing the kidney stone, and every 6-12 months after that.  Strain your urine every time you urinate, for as long as directed. Use the strainer that your health care provider recommends.  Do not throw out the kidney stone after passing it. Keep the stone so it can be tested by your health care provider. Testing the makeup of your kidney stone may help prevent you from getting kidney stones in the future.  Take over-the-counter and prescription medicines only as told by your health care provider.  Keep all follow-up visits as told by your health care provider. This is important. You may need follow-up X-rays or ultrasounds to make sure that your stone has passed. How is this prevented? To prevent another kidney stone:  Drink enough fluid to keep your urine clear or pale yellow. This is the best way to prevent kidney stones.  Eat a healthy diet and follow recommendations from your health care provider about foods to avoid. You may be instructed to eat a low-protein diet. Recommendations vary depending on the type of kidney stone that you have.  Maintain a healthy weight. Contact a health care provider if:  You have pain that gets worse or does not get better with medicine. Get help right away if:  You have a fever or chills.  You develop severe pain.  You develop new abdominal pain.  You faint.  You are unable to urinate. This information is not intended to replace advice given to you by your health care provider. Make sure you discuss any questions you have with your health care provider. Document Released: 11/12/2005 Document Revised: 06/24/2018 Document Reviewed: 04/27/2016 Elsevier Patient Education  2020 [place]. Ureteral Stent Implantation, Care After This sheet gives you  information about how to care for yourself after your procedure. Your health care provider may also give you more specific instructions. If you have problems or questions, contact your health care provider. What can I expect after the procedure? After the procedure, it is common to have:  Nausea.  Mild pain when you urinate. You may feel this pain in your lower back or lower abdomen. The pain should stop within a few minutes after you urinate. This may last for up to 1 week.  A small amount of blood in your urine for several days. Follow these instructions at home: Medicines  Take over-the-counter and prescription medicines only as told by your health care  provider.  If you were prescribed an antibiotic medicine, take it as told by your health care provider. Do not stop taking the antibiotic even if you start to feel better.  Do not drive for 24 hours if you were given a sedative during your procedure.  Ask your health care provider if the medicine prescribed to you requires you to avoid driving or using heavy machinery. Activity  Rest as told by your health care provider.  Avoid sitting for a long time without moving. Get up to take short walks every 1-2 hours. This is important to improve blood flow and breathing. Ask for help if you feel weak or unsteady.  Return to your normal activities as told by your health care provider. Ask your health care provider what activities are safe for you. General instructions   Watch for any blood in your urine. Call your health care provider if the amount of blood in your urine increases.  If you have a catheter: ? Follow instructions from your health care provider about taking care of your catheter and collection bag. ? Do not take baths, swim, or use a hot tub until your health care provider approves. Ask your health care provider if you may take showers. You may only be allowed to take sponge baths.  Drink enough fluid to keep your urine  pale yellow.  Do not use any products that contain nicotine or tobacco, such as cigarettes, e-cigarettes, and chewing tobacco. These can delay healing after surgery. If you need help quitting, ask your health care provider.  Keep all follow-up visits as told by your health care provider. This is important. Contact a health care provider if:  You have pain that gets worse or does not get better with medicine, especially pain when you urinate.  You have difficulty urinating.  You feel nauseous or you vomit repeatedly during a period of more than 2 days after the procedure. Get help right away if:  Your urine is dark red or has blood clots in it.  You are leaking urine (have incontinence).  The end of the stent comes out of your urethra.  You cannot urinate.  You have sudden, sharp, or severe pain in your abdomen or lower back.  You have a fever.  You have swelling or pain in your legs.  You have difficulty breathing. Summary  After the procedure, it is common to have mild pain when you urinate that goes away within a few minutes after you urinate. This may last for up to 1 week.  Watch for any blood in your urine. Call your health care provider if the amount of blood in your urine increases.  Take over-the-counter and prescription medicines only as told by your health care provider.  Drink enough fluid to keep your urine pale yellow. This information is not intended to replace advice given to you by your health care provider. Make sure you discuss any questions you have with your health care provider. Document Released: 07/15/2013 Document Revised: 08/19/2018 Document Reviewed: 08/20/2018 Elsevier Patient Education  2020 [place]   1) The drugs that you were given will stay in your system until tomorrow so for the next 24 hours you should not:  A) Drive an automobile B) Make any legal decisions C) Drink any alcoholic  beverage   2) You may resume regular meals tomorrow.  Today it is better to start with liquids and gradually work up to solid foods.  You may eat anything you prefer, but it is better to start with liquids, then soup and crackers, and gradually work up to solid foods.   3) Please notify your doctor immediately if you have any unusual bleeding, trouble breathing, redness and pain at the surgery site, drainage, fever, or pain not relieved by medication.    4) Additional Instructions:        Please contact your physician with any problems or Same Day Surgery at [phone], Monday through Friday 6 am to 4 pm, or [place] at [facility] number at [phone].

## 2019-11-23 NOTE — Transfer of Care (Signed)
Immediate Anesthesia Transfer of Care Note  Patient: Michael Jefferson.  Procedure(s) Performed: CYSTOSCOPY/URETEROSCOPY/HOLMIUM LASER/STENT PLACEMENT (Right Ureter) CYSTOSCOPY WITH RETROGRADE PYELOGRAM (Right Ureter) TRANSURETHRAL RESECTION OF BLADDER TUMOR (TURBT) (N/A Bladder)  Patient Location: PACU  Anesthesia Type:General  Level of Consciousness: awake, oriented, drowsy and patient cooperative  Airway & Oxygen Therapy: Patient Spontanous Breathing and Patient connected to face mask oxygen  Post-op Assessment: Report given to RN, Post -op Vital signs reviewed and stable and Patient moving all extremities  Post vital signs: Reviewed and stable  Last Vitals:  Vitals Value Taken Time  BP 123/76 11/23/19 1800  Temp 36.2 C 11/23/19 1757  Pulse 112 11/23/19 1804  Resp 14 11/23/19 1804  SpO2 98 % 11/23/19 1804  Vitals shown include unvalidated device data.  Last Pain:  Vitals:   11/23/19 1757  TempSrc:   PainSc: 0-No pain         Complications: No apparent anesthesia complications

## 2019-11-23 NOTE — Care Management (Signed)
He normally runs 100-110. Gave him oral metroprolol preop. He has done well.

## 2020-03-29 IMAGING — CR DG CHEST 2V
1 series · 2 of 2 positions shown · non-contrast
Comparison: 03/16/2015.

CLINICAL DATA: Cough for 2 months.

EXAM:
CHEST - 2 VIEW

[Series 1: dg chest 2 view · 0.14mm/px · 2 of 2 slices shown]
[im 1/2]
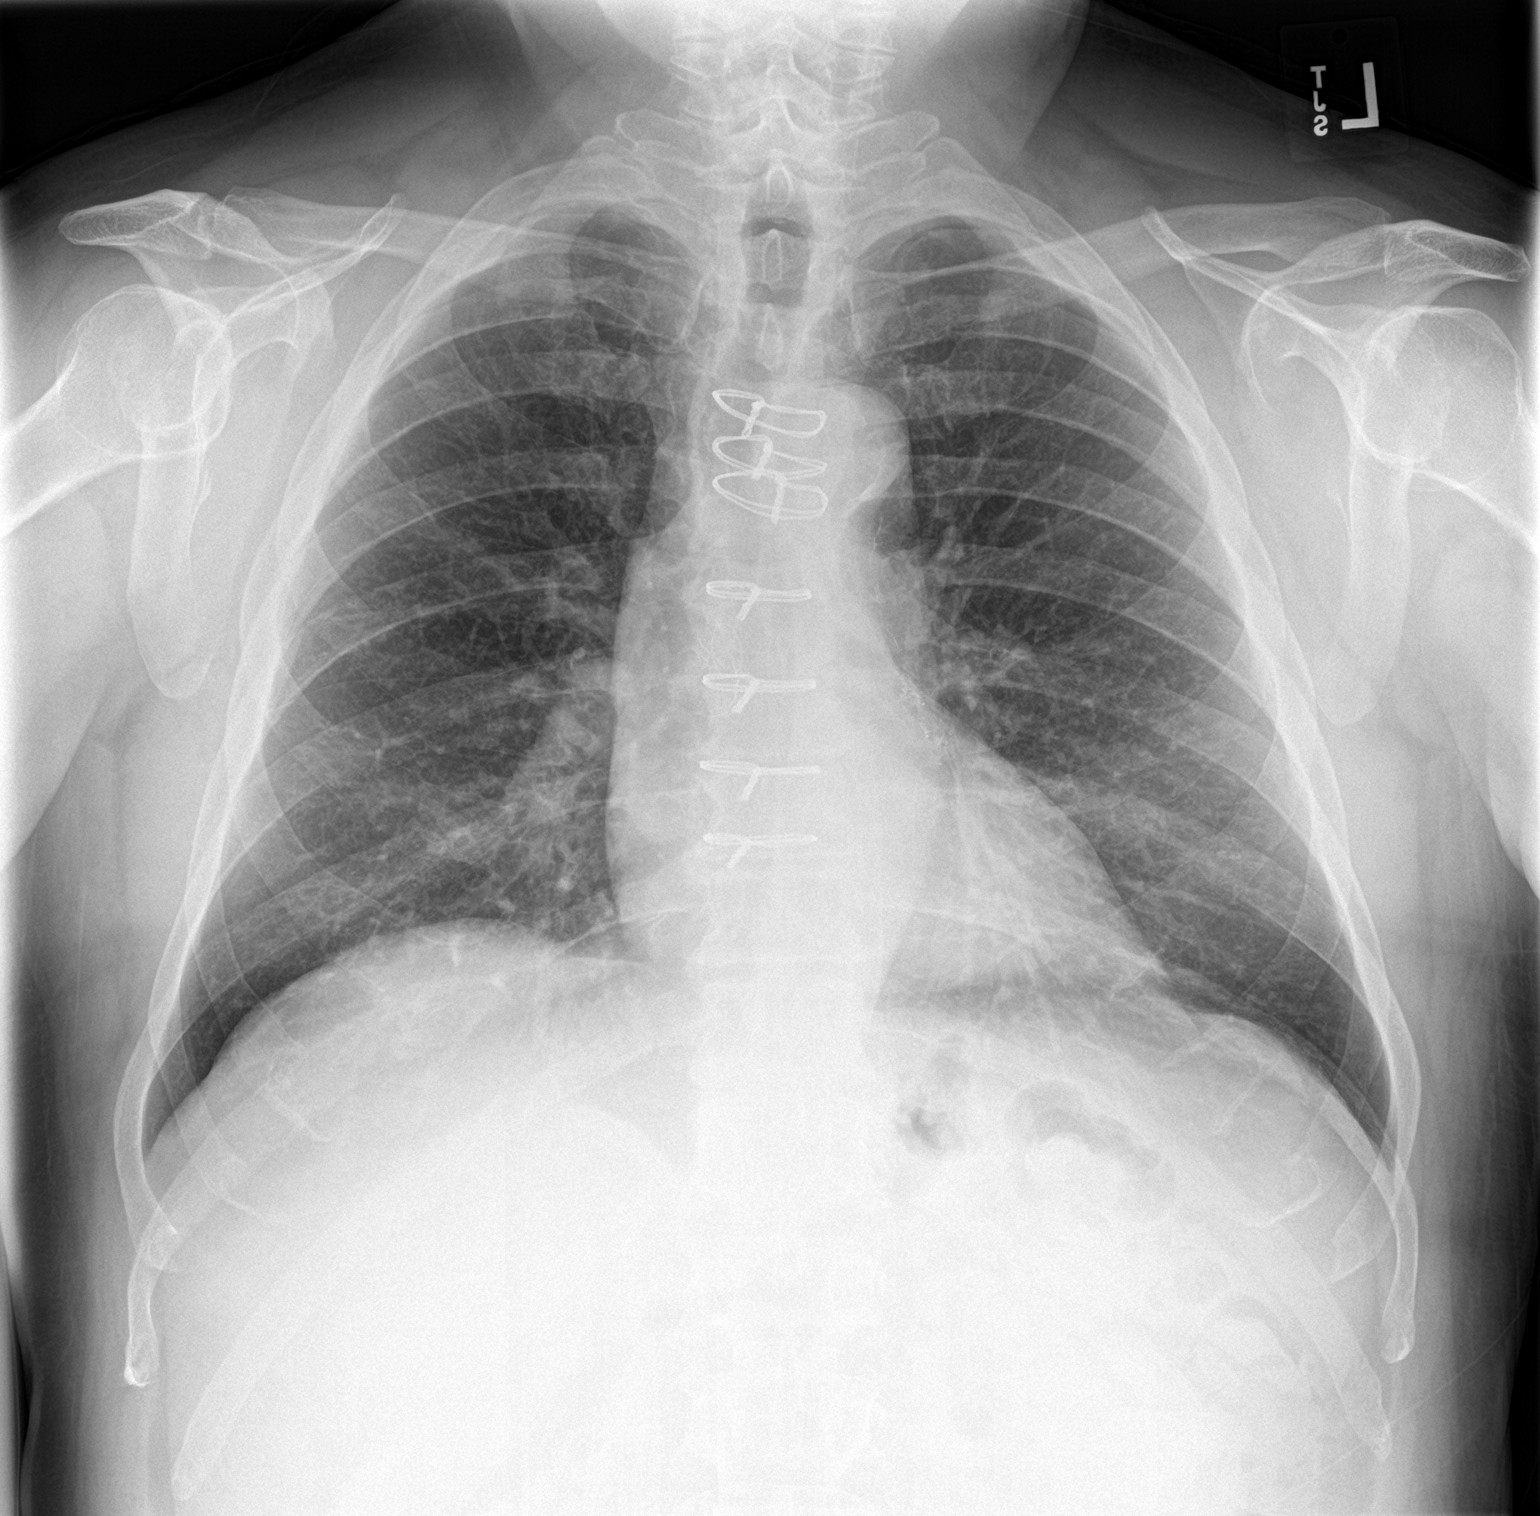
[im 2/2]
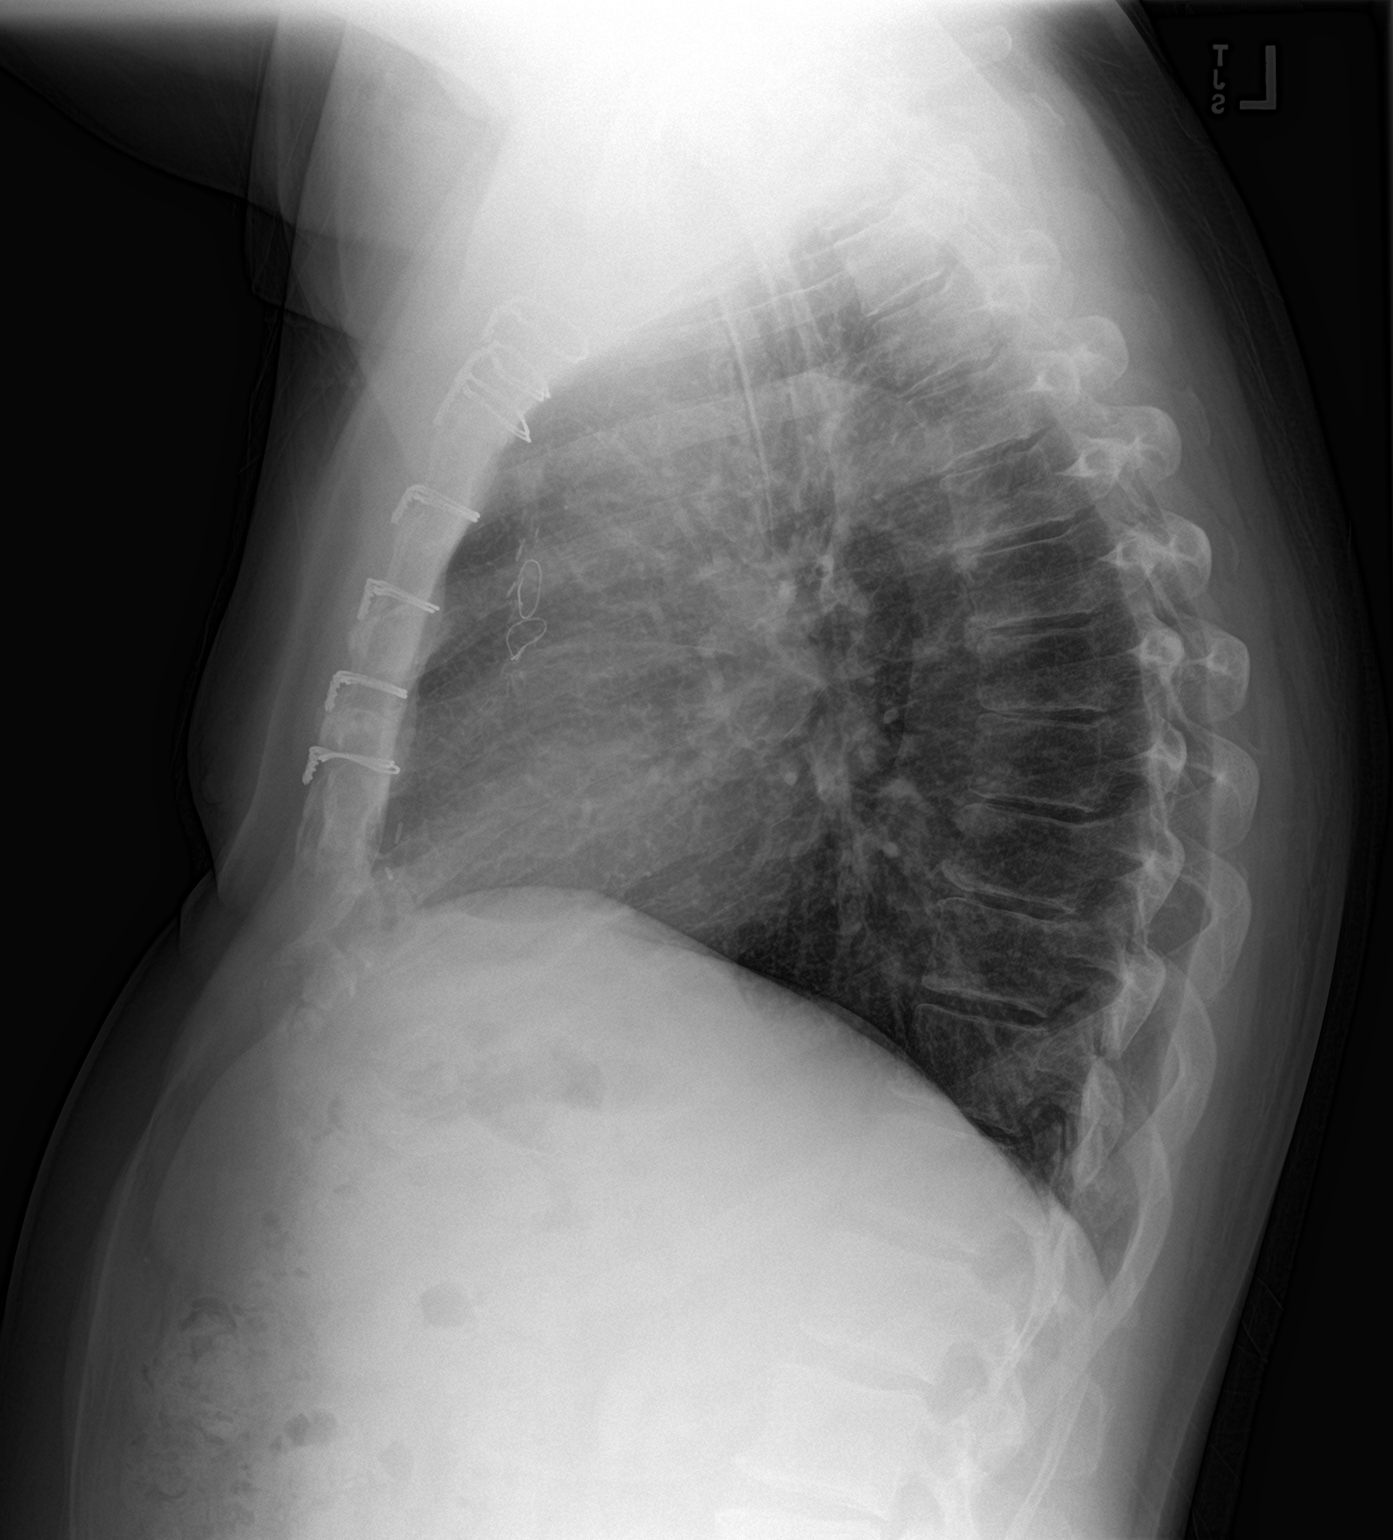

[2 of 2 positions shown; findings below may reference images not displayed]

FINDINGS: Prior median sternotomy. Prior CABG. Heart size normal. No pulmonary
venous congestion. Mild bilateral interstitial prominence noted.
Active interstitial lung disease including atypical pneumonitis
could present in this fashion. No pleural effusion or pneumothorax.
IMPRESSION: 1.  Prior CABG.  Heart size normal.  No pulmonary venous congestion.

2. Mild bilateral interstitial prominence. Active interstitial
disease atypical pneumonitis could present in this fashion.

## 2021-01-05 LAB — EXTERNAL GENERIC LAB PROCEDURE: COLOGUARD: NEGATIVE

## 2023-12-13 ENCOUNTER — Ambulatory Visit
Admission: EM | Admit: 2023-12-13 | Discharge: 2023-12-13 | Disposition: A | Discharge status: Home / Self Care | Payer: Medicare Other | Attending: Emergency Medicine | Admitting: Emergency Medicine

## 2023-12-13 DIAGNOSIS — R509 Fever, unspecified: Secondary | ICD-10-CM | POA: Insufficient documentation

## 2023-12-13 DIAGNOSIS — J069 Acute upper respiratory infection, unspecified: Secondary | ICD-10-CM | POA: Diagnosis present

## 2023-12-13 DIAGNOSIS — Z20822 Contact with and (suspected) exposure to covid-19: Secondary | ICD-10-CM | POA: Insufficient documentation

## 2023-12-13 MED ORDER — ACETAMINOPHEN 325 MG PO TABS
975.0000 mg | ORAL_TABLET | Freq: Once | ORAL | Status: AC
  Administered 2023-12-13: 975 mg via ORAL

## 2023-12-13 MED ORDER — PAXLOVID (300/100) 20 X 150 MG & 10 X 100MG PO TBPK: 3.0000 | ORAL_TABLET | Freq: Two times a day (BID) | ORAL | 0 refills | Status: AC
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
## 2023-12-13 NOTE — Discharge Instructions (Signed)
COVID-19 is a virus and should steadily improve in time it can take up to 7 to 10 days before you truly start to see a turnaround however things will get better, test is pending up to 24 hours, you will be notified of positive test results only  You had a fever in clinic of 102.5 and you have been given Tylenol, may take another dose in 6 hours  Begin Paxlovid every morning and every evening for 5 days to reduce the amount of virus in your body which will help to minimize your symptoms  Per the CDC you will need to quarantine and to you are 24 hours without a fever, which you no longer have a fever you may continue activity wearing mask    You can take Tylenol and/or Ibuprofen as needed for fever reduction and pain relief.   For cough: honey 1/2 to 1 teaspoon (you can dilute the honey in water or another fluid).  You can also use guaifenesin and dextromethorphan for cough. You can use a humidifier for chest congestion and cough.  If you don't have a humidifier, you can sit in the bathroom with the hot shower running.      For sore throat: try warm salt water gargles, cepacol lozenges, throat spray, warm tea or water with lemon/honey, popsicles or ice, or OTC cold relief medicine for throat discomfort.   For congestion: take a daily anti-histamine like Zyrtec, Claritin, and a oral decongestant, such as pseudoephedrine.  You can also use Flonase 1-2 sprays in each nostril daily.   It is important to stay hydrated: drink plenty of fluids (water, gatorade/powerade/pedialyte, juices, or teas) to keep your throat moisturized and help further relieve irritation/discomfort.

## 2023-12-14 LAB — SARS CORONAVIRUS 2 (TAT 6-24 HRS): SARS Coronavirus 2: POSITIVE — AB
